# Patient Record
Sex: Male | Born: 1990 | Race: Black or African American | Hispanic: No | Marital: Single | Smoking: Former smoker
Health system: Southern US, Community
[De-identification: ages and names within clinical notes are randomized; demographics above are authoritative.]

## PROBLEM LIST (undated history)

## (undated) DIAGNOSIS — K649 Unspecified hemorrhoids: Secondary | ICD-10-CM

---

## 2013-02-22 ENCOUNTER — Encounter (HOSPITAL_COMMUNITY): Payer: Self-pay | Admitting: Emergency Medicine

## 2013-02-22 ENCOUNTER — Emergency Department (HOSPITAL_COMMUNITY)
Admission: EM | Admit: 2013-02-22 | Discharge: 2013-02-22 | Disposition: A | Discharge status: Home / Self Care | Payer: PRIVATE HEALTH INSURANCE | Attending: Emergency Medicine | Admitting: Emergency Medicine

## 2013-02-22 DIAGNOSIS — K029 Dental caries, unspecified: Secondary | ICD-10-CM | POA: Insufficient documentation

## 2013-02-22 DIAGNOSIS — K089 Disorder of teeth and supporting structures, unspecified: Secondary | ICD-10-CM | POA: Insufficient documentation

## 2013-02-22 DIAGNOSIS — K0889 Other specified disorders of teeth and supporting structures: Secondary | ICD-10-CM

## 2013-02-22 DIAGNOSIS — F17200 Nicotine dependence, unspecified, uncomplicated: Secondary | ICD-10-CM | POA: Insufficient documentation

## 2013-02-22 MED ORDER — HYDROCODONE-ACETAMINOPHEN 5-325 MG PO TABS: 1.0000 | ORAL_TABLET | ORAL | Status: DC | PRN

## 2013-02-22 MED ORDER — PENICILLIN V POTASSIUM 500 MG PO TABS: 500.0000 mg | ORAL_TABLET | Freq: Three times a day (TID) | ORAL | Status: DC

## 2013-02-22 MED ORDER — HYDROCODONE-ACETAMINOPHEN 5-325 MG PO TABS
2.0000 | ORAL_TABLET | Freq: Once | ORAL | Status: AC
  Administered 2013-02-22: 2 via ORAL
  Filled 2013-02-22: qty 2

## 2013-02-22 NOTE — ED Notes (Signed)
C/o L lower toothache x 2 days.

## 2013-02-22 NOTE — ED Provider Notes (Signed)
CSN: [id number]     Arrival date & time 02/22/13  0049 History   None    Chief Complaint  Patient presents with  . Dental Pain   (Consider location/radiation/quality/duration/timing/severity/associated sxs/prior Treatment) HPI History provided by pt.   Pt presents w/ left lower dental pain x 2 days.  No relief w/ OTC analgesics.  No associated fever.  Denies trauma.  Has a dentist.   History reviewed. No pertinent past medical history. History reviewed. No pertinent past surgical history. No family history on file. History  Substance Use Topics  . Smoking status: Current Every Day Smoker  . Smokeless tobacco: Not on file  . Alcohol Use: No    Review of Systems  All other systems reviewed and are negative.    Allergies  Review of patient's allergies indicates not on file.  Home Medications   Current Outpatient Rx  Name  Route  Sig  Dispense  Refill  . HYDROcodone-acetaminophen (NORCO/VICODIN) 5-325 MG per tablet   Oral   Take 1 tablet by mouth every 4 (four) hours as needed for moderate pain.   15 tablet   0   . penicillin v potassium (VEETID) 500 MG tablet   Oral   Take 1 tablet (500 mg total) by mouth 3 (three) times daily.   30 tablet   0    BP 146/100  Pulse 61  Temp(Src) 98.1 F (36.7 C) (Oral)  Resp 18  Ht 6' (1.829 m)  Wt 226 lb (102.513 kg)  BMI 30.64 kg/m2  SpO2 100% Physical Exam  Nursing note and vitals reviewed. Constitutional: He is oriented to person, place, and time. He appears well-developed and well-nourished.  HENT:  Head: Normocephalic and atraumatic.  Mouth/Throat: Uvula is midline and oropharynx is clear and moist. No trismus in the jaw.  Deep dental carie top surface of L lower 1st molar.  Ttp w/ guarding. No edema/erythema of gingiva/buccal mucosa.    Eyes:  Normal appearance  Neck: Normal range of motion. Neck supple.  No submandibular edema  Lymphadenopathy:    He has no cervical adenopathy.  Neurological: He is alert and  oriented to person, place, and time.  Psychiatric: He has a normal mood and affect. His behavior is normal.    ED Course  Procedures (including critical care time) Labs Review Labs Reviewed - No data to display Imaging Review No results found.  EKG Interpretation   None       MDM   1. Pain, dental    22yo healthy M presents w/ dental pain.  Will treat for possible periapical abscess.  Received first dose of vicodin and penicillin in ED.  He has a dentist to f/u with.  Return precautions discussed.     Otilio Miu, PA-C 02/22/13 1515  Otilio Miu, PA-C 02/22/13 [phone]

## 2013-02-22 NOTE — ED Provider Notes (Signed)
Medical screening examination/treatment/procedure(s) were performed by non-physician practitioner and as supervising physician I was immediately available for consultation/collaboration.  EKG Interpretation   None         Mark Boone Jonathan Corpus, MD 02/22/13 1850 

## 2013-02-22 NOTE — ED Notes (Signed)
Pt reports his friend is driving him home.  

## 2014-05-21 ENCOUNTER — Emergency Department (HOSPITAL_COMMUNITY)
Admission: EM | Admit: 2014-05-21 | Discharge: 2014-05-21 | Disposition: A | Discharge status: Home / Self Care | Payer: 59 | Attending: Emergency Medicine | Admitting: Emergency Medicine

## 2014-05-21 ENCOUNTER — Encounter (HOSPITAL_COMMUNITY): Payer: Self-pay | Admitting: Emergency Medicine

## 2014-05-21 DIAGNOSIS — J029 Acute pharyngitis, unspecified: Secondary | ICD-10-CM | POA: Diagnosis not present

## 2014-05-21 DIAGNOSIS — Z720 Tobacco use: Secondary | ICD-10-CM | POA: Diagnosis not present

## 2014-05-21 DIAGNOSIS — J039 Acute tonsillitis, unspecified: Secondary | ICD-10-CM

## 2014-05-21 LAB — RAPID STREP SCREEN (MED CTR MEBANE ONLY): STREPTOCOCCUS, GROUP A SCREEN (DIRECT): NEGATIVE

## 2014-05-21 MED ORDER — HYDROCODONE-ACETAMINOPHEN 7.5-325 MG/15ML PO SOLN: 10.0000 mL | Freq: Four times a day (QID) | ORAL | Status: DC | PRN

## 2014-05-21 MED ORDER — IBUPROFEN 600 MG PO TABS: 600.0000 mg | ORAL_TABLET | Freq: Four times a day (QID) | ORAL | Status: DC | PRN

## 2014-05-21 NOTE — ED Notes (Signed)
The patient said he has had throat pain for the last two day.  He said he noticed white patches on his tonsils today.  He rates his pain 7/10.  He did say he took tylenol extra strength for the pain.  He denies any cough.

## 2014-05-21 NOTE — ED Provider Notes (Signed)
CSN: [id number]     Arrival date & time 05/21/14  1957 History  This chart is scribed for non-physician practitioner, Junius FinnerErin O'Malley, PA-C, working with Gilda Creasehristopher J. Pollina, MD by Abel PrestoKara Demonbreun, ED Scribe.  This patient was seen in room TR05C/TR05C and the patient's care was started 8:49 PM.     Chief Complaint  Patient presents with  . Sore Throat    The patient said he has had throat pain for the last two day.  He said he noticed white patches on his tonsils today.  He rates his pain 7/10.     Patient is a 24 y.o. male presenting with pharyngitis. The history is provided by the patient. No language interpreter was used.  Sore Throat   HPI Comments: Mark Boone is a 24 y.o. male who presents to the Emergency Department complaining of 7/10 sore throat with onset yesterday. Pt notes subjective fever. Pt has taken Tylenol for relief.  He has been able to swallow liquids without difficulty. Pt has NKDA. Pt denies cough, SOB, nausea, vomiting, and abdominal pain. No sick contacts or recent travel.   History reviewed. No pertinent past medical history. History reviewed. No pertinent past surgical history. History reviewed. No pertinent family history. History  Substance Use Topics  . Smoking status: Current Every Day Smoker  . Smokeless tobacco: Not on file  . Alcohol Use: No    Review of Systems  Constitutional: Positive for fever.  HENT: Positive for sore throat.   Respiratory: Negative for cough.   Gastrointestinal: Negative for nausea, vomiting and abdominal distention.  All other systems reviewed and are negative.     Allergies  Review of patient's allergies indicates no known allergies.  Home Medications   Prior to Admission medications   Medication Sig Start Date End Date Taking? Authorizing Provider  acetaminophen (TYLENOL) 160 MG/5ML solution Take 240 mg by mouth every 6 (six) hours as needed for moderate pain.   Yes Historical Provider, MD   HYDROcodone-acetaminophen (HYCET) 7.5-325 mg/15 ml solution Take 10 mLs by mouth every 6 (six) hours as needed for moderate pain. 05/21/14 05/21/15  Junius FinnerErin O'Malley, PA-C  HYDROcodone-acetaminophen (NORCO/VICODIN) 5-325 MG per tablet Take 1 tablet by mouth every 4 (four) hours as needed for moderate pain. Patient not taking: Reported on 05/21/2014 02/22/13   Arie Sabinaatherine E Schinlever, PA-C  ibuprofen (ADVIL,MOTRIN) 600 MG tablet Take 1 tablet (600 mg total) by mouth every 6 (six) hours as needed. 05/21/14   Junius FinnerErin O'Malley, PA-C  penicillin v potassium (VEETID) 500 MG tablet Take 1 tablet (500 mg total) by mouth 3 (three) times daily. Patient not taking: Reported on 05/21/2014 02/22/13   Arie Sabinaatherine E Schinlever, PA-C   BP 117/71 mmHg  Pulse 75  Temp(Src) 99.3 F (37.4 C) (Oral)  Resp 18  SpO2 98% Physical Exam  Constitutional: He is oriented to person, place, and time. He appears well-developed and well-nourished.  HENT:  Head: Normocephalic and atraumatic.  Right Ear: Hearing, tympanic membrane, external ear and ear canal normal.  Left Ear: Hearing, tympanic membrane, external ear and ear canal normal.  Nose: Nose normal.  Mouth/Throat: Uvula is midline and mucous membranes are normal. No trismus in the jaw. Oropharyngeal exudate, posterior oropharyngeal edema and posterior oropharyngeal erythema present. No tonsillar abscesses.  Eyes: EOM are normal.  Neck: Normal range of motion. Neck supple.  No nuchal rigidity or meningeal signs.   Cardiovascular: Normal rate, regular rhythm and normal heart sounds.   Pulmonary/Chest: Effort normal. No stridor. No respiratory  distress. He has no wheezes. He has no rales. He exhibits no tenderness.  Musculoskeletal: Normal range of motion.  Neurological: He is alert and oriented to person, place, and time.  Skin: Skin is warm and dry.  Psychiatric: He has a normal mood and affect. His behavior is normal.  Nursing note and vitals reviewed.   ED Course  Procedures  (including critical care time) DIAGNOSTIC STUDIES: Oxygen Saturation is 97% on room air, normal by my interpretation.    COORDINATION OF CARE: 8:51 PM Discussed treatment plan with patient at beside, the patient agrees with the plan and has no further questions at this time.   Labs Review Labs Reviewed  RAPID STREP SCREEN  CULTURE, GROUP A STREP    Imaging Review No results found.   EKG Interpretation None      MDM   Final diagnoses:  Sore throat  Tonsillitis   Pt presenting to ED with sore throat, bilateral erythema, edema, and exudate present. No respiratory distress, No stridor.  Rapid strep: negative.  Strep Culture pending.  Will tx pt symptomatically for pain. Rx: hycet and ibuprofen. Home care instructions provided. Advised to f/u with PCP in 3-4 days for symptom recheck. Advised to return to ED for worsening symptoms including difficulty breathing, unable to keep down fluids, or other new concerning symptoms develop.  I personally performed the services described in this documentation, which was scribed in my presence. The recorded information has been reviewed and is accurate.    Junius Finner, PA-C 05/21/14 2223  Gilda Crease, MD 05/21/14 [phone]

## 2014-05-23 ENCOUNTER — Encounter (HOSPITAL_COMMUNITY): Payer: Self-pay | Admitting: Physical Medicine and Rehabilitation

## 2014-05-23 ENCOUNTER — Emergency Department (HOSPITAL_COMMUNITY)
Admission: EM | Admit: 2014-05-23 | Discharge: 2014-05-23 | Disposition: A | Discharge status: Home / Self Care | Payer: 59 | Attending: Emergency Medicine | Admitting: Emergency Medicine

## 2014-05-23 DIAGNOSIS — J029 Acute pharyngitis, unspecified: Secondary | ICD-10-CM | POA: Diagnosis not present

## 2014-05-23 DIAGNOSIS — Z79899 Other long term (current) drug therapy: Secondary | ICD-10-CM | POA: Diagnosis not present

## 2014-05-23 DIAGNOSIS — Z720 Tobacco use: Secondary | ICD-10-CM | POA: Insufficient documentation

## 2014-05-23 DIAGNOSIS — Z792 Long term (current) use of antibiotics: Secondary | ICD-10-CM | POA: Insufficient documentation

## 2014-05-23 LAB — RAPID HIV SCREEN (WH-MAU): Rapid HIV Screen: NONREACTIVE

## 2014-05-23 LAB — MONONUCLEOSIS SCREEN: MONO SCREEN: NEGATIVE

## 2014-05-23 MED ORDER — OXYCODONE-ACETAMINOPHEN 5-325 MG PO TABS: 1.0000 | ORAL_TABLET | Freq: Once | ORAL | Status: DC

## 2014-05-23 MED ORDER — AMOXICILLIN 500 MG PO CAPS: 500.0000 mg | ORAL_CAPSULE | Freq: Three times a day (TID) | ORAL | Status: DC

## 2014-05-23 MED ORDER — DEXAMETHASONE SODIUM PHOSPHATE 10 MG/ML IJ SOLN
10.0000 mg | Freq: Once | INTRAMUSCULAR | Status: AC
  Administered 2014-05-23: 10 mg via INTRAVENOUS
  Filled 2014-05-23: qty 1

## 2014-05-23 NOTE — Discharge Instructions (Signed)
Amoxicillin as prescribed until all gone. Continue motrin and tylenol for fever and pain. Follow up with your doctor or an urgent care if not improving. Mono screen and HIV screen negative in ED.   Pharyngitis Pharyngitis is redness, pain, and swelling (inflammation) of your pharynx.  CAUSES  Pharyngitis is usually caused by infection. Most of the time, these infections are from viruses (viral) and are part of a cold. However, sometimes pharyngitis is caused by bacteria (bacterial). Pharyngitis can also be caused by allergies. Viral pharyngitis may be spread from person to person by coughing, sneezing, and personal items or utensils (cups, forks, spoons, toothbrushes). Bacterial pharyngitis may be spread from person to person by more intimate contact, such as kissing.  SIGNS AND SYMPTOMS  Symptoms of pharyngitis include:   Sore throat.   Tiredness (fatigue).   Low-grade fever.   Headache.  Joint pain and muscle aches.  Skin rashes.  Swollen lymph nodes.  Plaque-like film on throat or tonsils (often seen with bacterial pharyngitis). DIAGNOSIS  Your health care provider will ask you questions about your illness and your symptoms. Your medical history, along with a physical exam, is often all that is needed to diagnose pharyngitis. Sometimes, a rapid strep test is done. Other lab tests may also be done, depending on the suspected cause.  TREATMENT  Viral pharyngitis will usually get better in 3-4 days without the use of medicine. Bacterial pharyngitis is treated with medicines that kill germs (antibiotics).  HOME CARE INSTRUCTIONS   Drink enough water and fluids to keep your urine clear or pale yellow.   Only take over-the-counter or prescription medicines as directed by your health care provider:   If you are prescribed antibiotics, make sure you finish them even if you start to feel better.   Do not take aspirin.   Get lots of rest.   Gargle with 8 oz of salt water (  tsp of salt per 1 qt of water) as often as every 1-2 hours to soothe your throat.   Throat lozenges (if you are not at risk for choking) or sprays may be used to soothe your throat. SEEK MEDICAL CARE IF:   You have large, tender lumps in your neck.  You have a rash.  You cough up green, yellow-brown, or bloody spit. SEEK IMMEDIATE MEDICAL CARE IF:   Your neck becomes stiff.  You drool or are unable to swallow liquids.  You vomit or are unable to keep medicines or liquids down.  You have severe pain that does not go away with the use of recommended medicines.  You have trouble breathing (not caused by a stuffy nose). MAKE SURE YOU:   Understand these instructions.  Will watch your condition.  Will get help right away if you are not doing well or get worse. Document Released: 03/04/2005 Document Revised: 12/23/2012 Document Reviewed: 11/09/2012 [facility] [employer] Patient Information 2015 [place] [place]. This information is not intended to replace advice given to you by your health care provider. Make sure you discuss any questions you have with your health care provider.

## 2014-05-23 NOTE — ED Provider Notes (Signed)
CSN: [id number]     Arrival date & time 05/23/14  0945 History   First MD Initiated Contact with Patient 05/23/14 1015     Chief Complaint  Patient presents with  . Sore Throat     (Consider location/radiation/quality/duration/timing/severity/associated sxs/prior Treatment) HPI Mark Boone is a 24 y.o. male with no medical problems, presents to ED with complaint of a sore throat. Pt states symptoms started 4 days ago. Patient states his throat feels swollen, states having difficulty with swallowing. With seen 2 days ago, had negative rapid strep, states was discharged with ibuprofen and hydrocodone which he has been taking. Last dose at 4 AM this morning. Today he feels like his swelling is even worse so he came back to the emergency department. He admits to fever and chills at home. He denies any nasal congestion, cough, ear pain. No headache, neck pain or stiffness. No other complaints.   History reviewed. No pertinent past medical history. History reviewed. No pertinent past surgical history. History reviewed. No pertinent family history. History  Substance Use Topics  . Smoking status: Current Every Day Smoker    Types: Cigarettes  . Smokeless tobacco: Not on file  . Alcohol Use: No    Review of Systems  Constitutional: Positive for fever and chills.  HENT: Positive for sore throat, trouble swallowing and voice change. Negative for congestion, ear pain, facial swelling, mouth sores and sinus pressure.   Respiratory: Negative for cough and shortness of breath.   Gastrointestinal: Negative for nausea, vomiting and abdominal pain.  Musculoskeletal: Negative for neck pain and neck stiffness.  Skin: Negative for rash.  Neurological: Negative for headaches.      Allergies  Review of patient's allergies indicates no known allergies.  Home Medications   Prior to Admission medications   Medication Sig Start Date End Date Taking? Authorizing Provider  acetaminophen  (TYLENOL) 160 MG/5ML solution Take 240 mg by mouth every 6 (six) hours as needed for moderate pain.    Historical Provider, MD  HYDROcodone-acetaminophen (HYCET) 7.5-325 mg/15 ml solution Take 10 mLs by mouth every 6 (six) hours as needed for moderate pain. 05/21/14 05/21/15  Junius FinnerErin O'Malley, PA-C  HYDROcodone-acetaminophen (NORCO/VICODIN) 5-325 MG per tablet Take 1 tablet by mouth every 4 (four) hours as needed for moderate pain. Patient not taking: Reported on 05/21/2014 02/22/13   Ruby Colaatherine Schinlever, PA-C  ibuprofen (ADVIL,MOTRIN) 600 MG tablet Take 1 tablet (600 mg total) by mouth every 6 (six) hours as needed. 05/21/14   Junius FinnerErin O'Malley, PA-C  penicillin v potassium (VEETID) 500 MG tablet Take 1 tablet (500 mg total) by mouth 3 (three) times daily. Patient not taking: Reported on 05/21/2014 02/22/13   Ruby Colaatherine Schinlever, PA-C   BP 132/74 mmHg  Pulse 80  Temp(Src) 98.3 F (36.8 C) (Oral)  Resp 18  Ht 6' (1.829 m)  Wt 210 lb (95.255 kg)  BMI 28.47 kg/m2  SpO2 99% Physical Exam  Constitutional: He appears well-developed and well-nourished. No distress.  HENT:  Head: Normocephalic and atraumatic.  Right Ear: Tympanic membrane, external ear and ear canal normal.  Left Ear: Tympanic membrane, external ear and ear canal normal.  Nose: Nose normal.  Mouth/Throat: Uvula is midline. Oropharyngeal exudate, posterior oropharyngeal edema and posterior oropharyngeal erythema present. No tonsillar abscesses.  Eyes: Conjunctivae are normal.  Neck: Neck supple.  Cardiovascular: Normal rate, regular rhythm and normal heart sounds.   Pulmonary/Chest: Effort normal. No respiratory distress. He has no wheezes. He has no rales.  Abdominal: Soft. Bowel sounds  are normal. He exhibits no distension. There is no tenderness. There is no rebound.  Musculoskeletal: He exhibits no edema.  Neurological: He is alert.  Skin: Skin is warm and dry.  Nursing note and vitals reviewed.   ED Course  Procedures (including  critical care time) Labs Review Labs Reviewed  MONONUCLEOSIS SCREEN    Imaging Review No results found.   EKG Interpretation None      MDM   Final diagnoses:  Pharyngitis    Patient are bilaterally enlarged tonsils with exudate. Uvula is midline. Tonsils are equal in size. Patient has no other complaints. Afebrile here, however 2 days ago he did have a temperature of 100.3 during his visit. His rapid strep was negative, once the culture still pending. I spoke with his mother on the phone who is very concerned about throat swelling and that he will have trouble breathing.  will give him decadron for swelling. Will check mono.   Mono spot negative. Home with amoxil, follow up with pcp.   Filed Vitals:   05/23/14 0949 05/23/14 1239  BP: 132/74 123/62  Pulse: 80 63  Temp: 98.3 F (36.8 C)   TempSrc: Oral   Resp: 18 16  Height: 6' (1.829 m)   Weight: 210 lb (95.255 kg)   SpO2: 99% 100%     Jaynie Crumble, PA-C 05/23/14 1635  Eber Hong, MD 05/23/14 1610  Eber Hong, MD 05/23/14 2043

## 2014-05-23 NOTE — ED Notes (Signed)
Pt presents to department for evaluation of sore throat. Was seen on 05/21/14 and diagnosed with tonsillitis. Pt reports increased pain this morning. Respirations unlabored. Speaking complete sentences. NAD.

## 2014-05-23 NOTE — ED Notes (Signed)
Pt here 2/5 for sore throat. Returns today for worsening throat swelling and pain. Tonsils very enlarged and with copious exudate. Pt reports fever and chills.

## 2014-05-24 LAB — CULTURE, GROUP A STREP: STREP A CULTURE: NEGATIVE

## 2015-03-22 ENCOUNTER — Encounter (HOSPITAL_COMMUNITY): Payer: Self-pay | Admitting: *Deleted

## 2015-03-22 ENCOUNTER — Emergency Department (HOSPITAL_COMMUNITY)
Admission: EM | Admit: 2015-03-22 | Discharge: 2015-03-22 | Disposition: A | Discharge status: Home / Self Care | Payer: 59 | Attending: Emergency Medicine | Admitting: Emergency Medicine

## 2015-03-22 DIAGNOSIS — F17210 Nicotine dependence, cigarettes, uncomplicated: Secondary | ICD-10-CM | POA: Diagnosis not present

## 2015-03-22 DIAGNOSIS — N509 Disorder of male genital organs, unspecified: Secondary | ICD-10-CM | POA: Diagnosis present

## 2015-03-22 DIAGNOSIS — K6289 Other specified diseases of anus and rectum: Secondary | ICD-10-CM | POA: Diagnosis not present

## 2015-03-22 DIAGNOSIS — K648 Other hemorrhoids: Secondary | ICD-10-CM | POA: Diagnosis not present

## 2015-03-22 DIAGNOSIS — B356 Tinea cruris: Secondary | ICD-10-CM | POA: Diagnosis not present

## 2015-03-22 DIAGNOSIS — K649 Unspecified hemorrhoids: Secondary | ICD-10-CM

## 2015-03-22 LAB — CBC WITH DIFFERENTIAL/PLATELET
Basophils Absolute: 0 10*3/uL (ref 0.0–0.1)
Basophils Relative: 1 %
EOS ABS: 0.1 10*3/uL (ref 0.0–0.7)
Eosinophils Relative: 2 %
HCT: 44.7 % (ref 39.0–52.0)
Hemoglobin: 14.9 g/dL (ref 13.0–17.0)
LYMPHS ABS: 3.1 10*3/uL (ref 0.7–4.0)
Lymphocytes Relative: 53 %
MCH: 31.2 pg (ref 26.0–34.0)
MCHC: 33.3 g/dL (ref 30.0–36.0)
MCV: 93.7 fL (ref 78.0–100.0)
MONO ABS: 0.4 10*3/uL (ref 0.1–1.0)
Monocytes Relative: 6 %
Neutro Abs: 2.2 10*3/uL (ref 1.7–7.7)
Neutrophils Relative %: 38 %
PLATELETS: 155 10*3/uL (ref 150–400)
RBC: 4.77 MIL/uL (ref 4.22–5.81)
RDW: 12.2 % (ref 11.5–15.5)
WBC: 5.8 10*3/uL (ref 4.0–10.5)

## 2015-03-22 LAB — URINALYSIS, ROUTINE W REFLEX MICROSCOPIC
Bilirubin Urine: NEGATIVE
GLUCOSE, UA: NEGATIVE mg/dL
Hgb urine dipstick: NEGATIVE
Ketones, ur: NEGATIVE mg/dL
LEUKOCYTES UA: NEGATIVE
NITRITE: NEGATIVE
PH: 6 (ref 5.0–8.0)
Protein, ur: NEGATIVE mg/dL
Specific Gravity, Urine: 1.026 (ref 1.005–1.030)

## 2015-03-22 LAB — POC OCCULT BLOOD, ED: Fecal Occult Bld: NEGATIVE

## 2015-03-22 LAB — RAPID HIV SCREEN (HIV 1/2 AB+AG)
HIV 1/2 Antibodies: NONREACTIVE
HIV-1 P24 ANTIGEN - HIV24: NONREACTIVE

## 2015-03-22 LAB — GC/CHLAMYDIA PROBE AMP (~~LOC~~) NOT AT ARMC
CHLAMYDIA, DNA PROBE: NEGATIVE
Neisseria Gonorrhea: NEGATIVE

## 2015-03-22 MED ORDER — IBUPROFEN 800 MG PO TABS: 800.0000 mg | ORAL_TABLET | Freq: Three times a day (TID) | ORAL | Status: DC

## 2015-03-22 MED ORDER — HYDROCORTISONE ACETATE 25 MG RE SUPP: 25.0000 mg | Freq: Two times a day (BID) | RECTAL | Status: DC

## 2015-03-22 MED ORDER — ECONAZOLE NITRATE 1 % EX CREA: TOPICAL_CREAM | Freq: Two times a day (BID) | CUTANEOUS | Status: DC

## 2015-03-22 NOTE — ED Notes (Signed)
Provider at the bedside.  

## 2015-03-22 NOTE — ED Provider Notes (Signed)
CSN: [id number]     Arrival date & time 03/22/15  0404 History   First MD Initiated Contact with Patient 03/22/15 0615     Chief Complaint  Patient presents with  . Genital Irritation      (Consider location/radiation/quality/duration/timing/severity/associated sxs/prior Treatment) HPI   TRUE Mark Boone is a 25 y.o. male, patient with no pertinent past medical history, presenting to the ED with pain with BMs for the last two weeks. Pt also complains of some blood on the paper. Pt rates the pain at 6/10 when it comes on, throbbing in natural, non-radiating, and slowly subsides after BM. Pt denies trauma to the area, including sexual contact. Patient additionally complains of some itching on the inside of his thighs that is been going on for about a week. Patient denies pain in this area. Pt denies fever/chills, N/V/C/D, abdominal pain, dysuria, penile pain or discharge, or any other complaints.     History reviewed. No pertinent past medical history. History reviewed. No pertinent past surgical history. No family history on file. Social History  Substance Use Topics  . Smoking status: Current Every Day Smoker    Types: Cigarettes  . Smokeless tobacco: Never Used  . Alcohol Use: Yes    Review of Systems  Constitutional: Negative for fever and chills.  Gastrointestinal: Negative for vomiting, abdominal pain, diarrhea, constipation and blood in stool.       Pain with BMs  Genitourinary: Negative for dysuria, hematuria, discharge, penile pain and testicular pain.  All other systems reviewed and are negative.     Allergies  Review of patient's allergies indicates no known allergies.  Home Medications   Prior to Admission medications   Medication Sig Start Date End Date Taking? Authorizing Provider  econazole nitrate 1 % cream Apply topically 2 (two) times daily. Until irritation subsides. Discontinue use and return for reevaluation if irritation increases. 03/22/15   Shawn C  Joy, PA-C  hydrocortisone (ANUCORT-HC) 25 MG suppository Place 1 suppository (25 mg total) rectally 2 (two) times daily. 03/22/15   Shawn C Joy, PA-C  ibuprofen (ADVIL,MOTRIN) 800 MG tablet Take 1 tablet (800 mg total) by mouth 3 (three) times daily. 03/22/15   Shawn C Joy, PA-C   BP 139/97 mmHg  Pulse 77  Temp(Src) 99.2 F (37.3 C) (Oral)  Resp 18  Ht 6' (1.829 m)  Wt 92.262 kg  BMI 27.58 kg/m2  SpO2 100% Physical Exam  Constitutional: He appears well-developed and well-nourished. No distress.  HENT:  Head: Normocephalic and atraumatic.  Eyes: Conjunctivae are normal. Pupils are equal, round, and reactive to light.  Cardiovascular: Normal rate and regular rhythm.   Pulmonary/Chest: Effort normal and breath sounds normal. No respiratory distress.  Abdominal: Soft. Bowel sounds are normal. There is no tenderness.  Genitourinary: Prostate normal and penis normal. Rectal exam shows internal hemorrhoid and tenderness. Guaiac negative stool.    Circumcised.  Penis and genitals without lesions. No testicular swelling or tenderness. No penile discharge. Rectal exam revealed soft, tender mass on the posterior wall of the rectum. No fluctuance to suggest abscess. No firmness to suggest a thrombosed hemorrhoid. No tenderness to the prostate. Med Tech, Malachi Bonds, served as Biomedical engineer during GU and rectal exam.  Musculoskeletal: He exhibits no edema or tenderness.  Neurological: He is alert.  Skin: Skin is warm and dry. He is not diaphoretic.  Nursing note and vitals reviewed.   ED Course  Procedures (including critical care time) Labs Review Labs Reviewed  URINALYSIS, ROUTINE W REFLEX MICROSCOPIC (  [facility]) - Abnormal; Notable for the following:    Color, Urine AMBER (*)    APPearance CLOUDY (*)    All other components within normal limits  CBC WITH DIFFERENTIAL/PLATELET  RAPID HIV SCREEN (HIV 1/2 AB+AG)  POC OCCULT BLOOD, ED  GC/CHLAMYDIA PROBE AMP ([place]) NOT AT [facility]    Imaging  Review No results found. I have personally reviewed and evaluated these lab results as part of my medical decision-making.   EKG Interpretation None      MDM   Final diagnoses:  Rectal pain  Hemorrhoids, unspecified hemorrhoid type  Tinea cruris    Agustin CreeZachary Muzquiz presents with pain with bowel movements and genital irritation.  This patient's presentation is most consistent with internal hemorrhoids versus anal fissure. Doubt prostatitis or epididymitis. Patient's irritation on the inside of his legs is most likely tinea cruris. Patient was told that he would need to follow up with a PCP as soon as possible on the hemorrhoids. The patient was given instructions for home care as well as return precautions. Patient voices understanding of these instructions, accepts the plan, and is comfortable with discharge.  Anselm PancoastShawn C Joy, PA-C 03/22/15 [id number]  Lyndal Pulleyaniel Knott, MD 03/23/15 [phone]

## 2015-03-22 NOTE — Discharge Instructions (Signed)
You have been seen today for rectal pain and genital irritation. Follow up with PCP as soon as possible for follow-up and chronic management. Return to ED should symptoms worsen. Sitz baths may help soothe the area.   Emergency Department Resource Guide 1) Find a Doctor and Pay Out of Pocket Although you won't have to find out who is covered by your insurance plan, it is a good idea to ask around and get recommendations. You will then need to call the office and see if the doctor you have chosen will accept you as a new patient and what types of options they offer for patients who are self-pay. Some doctors offer discounts or will set up payment plans for their patients who do not have insurance, but you will need to ask so you aren't surprised when you get to your appointment.  2) Contact Your Local Health Department Not all health departments have doctors that can see patients for sick visits, but many do, so it is worth a call to see if yours does. If you don't know where your local health department is, you can check in your phone book. The CDC also has a tool to help you locate your state's health department, and many state websites also have listings of all of their local health departments.  3) Find a Walk-in Clinic If your illness is not likely to be very severe or complicated, you may want to try a walk in clinic. These are popping up all over the country in pharmacies, drugstores, and shopping centers. They're usually staffed by nurse practitioners or physician assistants that have been trained to treat common illnesses and complaints. They're usually fairly quick and inexpensive. However, if you have serious medical issues or chronic medical problems, these are probably not your best option.  No Primary Care Doctor: - Call Health Connect at  [phone] - they can help you locate a primary care doctor that  accepts your insurance, provides certain services, etc. - Physician Referral Service-  [phone]  Chronic Pain Problems: Organization         Address  Phone   Notes  [facility]  [phone] Patients need to be referred by their primary care doctor.   Medication Assistance: Organization         Address  Phone   Notes  [facility] [address] [place], [place] [address] [phone] --Must be a resident of [facility] [place] -- Must have NO insurance coverage whatsoever (no Medicaid/ Medicare, etc.) -- The pt. MUST have a primary care doctor that directs their care regularly and follows them in the community   Med[facility]ist  [phone]   [employer]  [phone]    Agencies that provide inexpensive medical care: Organization         Address  Phone   Notes  [facility]  [phone]   [facility]    [phone]   [facility] [address] [place], [place] [address] [phone]   [facility] [address], [place] [phone]   [facility]anned Parenthood    [phone]   [facility]    [phone]   Community Health and [facility]  [address], [place] Phone:  [phone], Fax:  [phone] Hours of Operation:  9 am - 6 pm, M-F.  Also accepts Medicaid/Medicare and self-pay.  [facility]  [place] for [place], [address], [place] Phone: [phone], Fax: [phone]. Hours of Operation:  8:30 am - 5:30 pm, M-F.  Also accepts Medicaid and self-pay.  [facility] [address] [place] Phone: [phone]   [place], [place], [place] [phone], Ext. [phone] Mondays & Thursdays: 7-9 AM.  First 15 patients are seen on a first come, first serve basis.    [place] Providers:  Organization         Address  Phone   Notes  [facility] [address],  [place] [phone] Also accepts self-pay patients.  [facility] [id number] [place], [place]  [phone]   [place], [address], [place] [phone]   [facility] [address] [place] [phone]   Lucianne Lei [address], [place]   [phone] Only accepts [place] Access [place] patients after they have their name applied to their card.   Self-Pay (no insurance) in [facility]:  Organization         Address  Phone   Notes  Sickle Cell Patients, [facility] [place] [phone]   [facility] [place] [phone]   [facility] [place]  [place], [place], [place] [phone]   [facility]/Dr. Osei-Bonsu  [address], [place] or [address], [place] [phone] Phone number for both [place] and [place] locations is the same.  [facility] [address] [place] [phone]   [facility]  [address], [place] or [address] [place] [address] [phone]   [facility] [address], [place] [phone], phone; [phone], fax Sees patients 1st and 3rd Saturday of every month.  Must not qualify for public or private insurance (i.e. Medicaid, Medicare, [place] Health Choice, Veterans' Benefits)  Household income should be no more than 200% of the poverty level The clinic cannot treat you if you are pregnant or think you are pregnant  Sexually transmitted diseases are not treated at the clinic.    Dental Care: Organization         Address  Phone  Notes  [facility] Department of [facility] [place] [phone] Accepts children up to age 40 who are enrolled in  [place] or [place]; pregnant women with a Medicaid card; and children who have applied for Medicaid or [place] Health Choice, but were declined, whose parents can pay a reduced fee at time of service.  [facility] of [facility]  [address], [place] [phone] Accepts children up to age 85 who are enrolled in [place] or [place]; pregnant women with a Medicaid card; and children who have applied for Medicaid or [place] Health Choice, but were declined, whose parents can pay a reduced fee at time of service.  [place] [facility] PROGRAM  [place] [phone] Patients are seen by appointment only. Walk-ins are not accepted. [place] will see patients 62 years of age and older. Monday - Tuesday (8am-5pm) Most Wednesdays (8:30-5pm) $30 per visit,  cash only  [employer] Adult [employer] PROGRAM  [address], [place] [phone] Patients are seen by appointment only. Walk-ins are not accepted. [place] will see patients 68 years of age and older. One Wednesday Evening (Monthly: Volunteer Based).  $30 per visit, cash only  [place]  [phone] for adults; Children under age 32, call Graduate Pediatric Dentistry at [phone]. Children aged 36-14, please call [phone] to request a pediatric application.  Dental services are provided in all areas of dental care including fillings, crowns and bridges, complete and partial dentures, implants, gum treatment, root canals, and extractions. Preventive care is also provided. Treatment is provided to both adults and children. Patients are selected via a lottery and there is often a waiting list.   [facility] [address] [place]  [phone] [email or web address]   [facility] [address] [place], [place] [phone], Ext. [phone] Second and Fourth Thursday of each month, opens at 6:30  AM; Clinic ends at 9 AM.  Patients are seen on a first-come first-served basis, and a limited number are seen during each clinic.   [facility]  [address] Hillard Danker [place], [place] [phone]   Eligibility Requirements You must have lived in [place], [place], or [place] counties for at least the last three months.   You cannot be eligible for state or federal sponsored [employer], including [employer], [place], or [employer].   You generally cannot be eligible for healthcare insurance through your employer.    How to apply: Eligibility screenings are held every Tuesday and Wednesday afternoon from 1:00 pm until 4:00 pm. You do not need an appointment for the interview!  [facility] [address] [place], [place]   [place]  [place] Department  [place]  [phone]    Behavioral Health Resources in the Community: Intensive Outpatient Programs Organization         Address  Phone  Notes  Bejou Flanagan. [address], [place], [place] [phone]   [facility] Outpatient [address] [place], [place]   ADS: Alcohol & Drug Svcs [address] [place], [place]   [place] [address],  [place], [place] or [phone]   Substance Abuse Resources Organization         Address  Phone  Notes  Alcohol and Drug Services  [phone]   [place]  [phone]   [facility] [place]   Chinita Pester  [phone]   Residential & Outpatient Substance Abuse Program  [phone]   Psychological Services Organization         Address  Phone  Notes  [facility] [place]  [place]  [phone]   [place] [address] [place] or  [phone]    Mobile Crisis Teams Organization         Address  Phone  Notes  [facility], Mobile Crisis Care Unit  [phone]   [facility]  [address] [place], [place]   [place] Levels [address], [place] [phone]    Self-Help/Support Groups Organization         Address  Phone  Notes  [facility] [place] - variety of support groups  [place] Call for more information  Narcotics Anonymous (NA), [facility] [address], [employer] [place]  2 meetings at this location   Special educational needs teacher         Address  Phone  Notes  [facility] Residential Treatment [place],    [place]  [phone]   [facility]  [address] [place] [phone], [place], [place]   [place], [place] [phone] Admissions: 8am-3pm M-F  Incentives Substance [place] [address]    [place], [place] [phone]   [facility] [address] [place], [place], [place]   [facility] [address],  [place], [place]   [facility] - Intensive Outpatient [address]een [address], [place], [place]   [facility] ([place] [address].,  [place], [place] [phone] or [phone]   [facility] (RTS) [address] [place], [place] Accepts Medicaid  [facility] [place] [address],  [place] [phone] Substance Abuse/Addiction Treatment   [facility] Organization         Address  Phone  Notes  [facility]  [phone]   Domenic Schwab, PhD [address] Arlis Porta [place], [place]   [phone] or [phone]   [place] and [place], [place] [phone]   [facility] [address],  [place], [place] [phone] Insurance/Medicaid/sponsorship through [facility] and Families [address], [address] [place]                                    [place], [place] [phone] [place] [address] [place], [place] [phone]    Dr. Adele Schilder  [phone]   [facility] Dept. 1) [address], [place] 2) [place] 3)  [place] [address], [place] [phone]  [phone]   [place] [phone] or [phone] (After Hours)

## 2015-03-22 NOTE — ED Notes (Signed)
Patient states when he urinates it throbs and then it goes away.  Denies penile discharge but has noticed blood at times.  Denies nunprotected sex

## 2015-08-19 ENCOUNTER — Emergency Department (HOSPITAL_COMMUNITY): Payer: PRIVATE HEALTH INSURANCE

## 2015-08-19 ENCOUNTER — Emergency Department (HOSPITAL_COMMUNITY)
Admission: EM | Admit: 2015-08-19 | Discharge: 2015-08-19 | Disposition: A | Discharge status: Home / Self Care | Payer: PRIVATE HEALTH INSURANCE | Attending: Emergency Medicine | Admitting: Emergency Medicine

## 2015-08-19 ENCOUNTER — Encounter (HOSPITAL_COMMUNITY): Payer: Self-pay | Admitting: Emergency Medicine

## 2015-08-19 DIAGNOSIS — F17210 Nicotine dependence, cigarettes, uncomplicated: Secondary | ICD-10-CM | POA: Insufficient documentation

## 2015-08-19 DIAGNOSIS — Y999 Unspecified external cause status: Secondary | ICD-10-CM | POA: Insufficient documentation

## 2015-08-19 DIAGNOSIS — S60211A Contusion of right wrist, initial encounter: Secondary | ICD-10-CM | POA: Insufficient documentation

## 2015-08-19 DIAGNOSIS — Y9339 Activity, other involving climbing, rappelling and jumping off: Secondary | ICD-10-CM | POA: Insufficient documentation

## 2015-08-19 DIAGNOSIS — S0990XA Unspecified injury of head, initial encounter: Secondary | ICD-10-CM

## 2015-08-19 DIAGNOSIS — Y929 Unspecified place or not applicable: Secondary | ICD-10-CM | POA: Insufficient documentation

## 2015-08-19 DIAGNOSIS — S0083XA Contusion of other part of head, initial encounter: Secondary | ICD-10-CM | POA: Insufficient documentation

## 2015-08-19 LAB — I-STAT CHEM 8, ED
BUN: 13 mg/dL (ref 6–20)
Calcium, Ion: 1.16 mmol/L (ref 1.12–1.23)
Chloride: 102 mmol/L (ref 101–111)
Creatinine, Ser: 0.9 mg/dL (ref 0.61–1.24)
Glucose, Bld: 93 mg/dL (ref 65–99)
HCT: 47 % (ref 39.0–52.0)
Hemoglobin: 16 g/dL (ref 13.0–17.0)
Potassium: 3.8 mmol/L (ref 3.5–5.1)
Sodium: 142 mmol/L (ref 135–145)
TCO2: 25 mmol/L (ref 0–100)

## 2015-08-19 MED ORDER — IBUPROFEN 200 MG PO TABS
600.0000 mg | ORAL_TABLET | Freq: Once | ORAL | Status: AC
Start: 2015-08-19 — End: 2015-08-19
  Administered 2015-08-19: 600 mg via ORAL
  Filled 2015-08-19: qty 3

## 2015-08-19 MED ORDER — ACETAMINOPHEN 500 MG PO TABS
1000.0000 mg | ORAL_TABLET | Freq: Once | ORAL | Status: AC
  Administered 2015-08-19: 1000 mg via ORAL
  Filled 2015-08-19: qty 2

## 2015-08-19 MED ORDER — IBUPROFEN 800 MG PO TABS: 800.0000 mg | ORAL_TABLET | Freq: Three times a day (TID) | ORAL | Status: DC | PRN

## 2015-08-19 NOTE — Discharge Instructions (Signed)
Return here as needed.  Follow-up with a primary care doctor.  Use ice on the areas that are sore
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
## 2015-08-19 NOTE — ED Notes (Signed)
Patient transported to X-ray and CT 
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
## 2017-05-30 ENCOUNTER — Other Ambulatory Visit: Payer: Self-pay

## 2017-05-30 ENCOUNTER — Encounter (HOSPITAL_COMMUNITY): Payer: Self-pay | Admitting: Emergency Medicine

## 2017-05-30 ENCOUNTER — Emergency Department (HOSPITAL_COMMUNITY)
Admission: EM | Admit: 2017-05-30 | Discharge: 2017-05-30 | Disposition: A | Discharge status: Home / Self Care | Payer: PRIVATE HEALTH INSURANCE | Attending: Emergency Medicine | Admitting: Emergency Medicine

## 2017-05-30 DIAGNOSIS — Z206 Contact with and (suspected) exposure to human immunodeficiency virus [HIV]: Secondary | ICD-10-CM

## 2017-05-30 DIAGNOSIS — F17210 Nicotine dependence, cigarettes, uncomplicated: Secondary | ICD-10-CM | POA: Insufficient documentation

## 2017-05-30 DIAGNOSIS — Z202 Contact with and (suspected) exposure to infections with a predominantly sexual mode of transmission: Secondary | ICD-10-CM | POA: Insufficient documentation

## 2017-05-30 DIAGNOSIS — Z7252 High risk homosexual behavior: Secondary | ICD-10-CM | POA: Insufficient documentation

## 2017-05-30 LAB — URINALYSIS, ROUTINE W REFLEX MICROSCOPIC
Bilirubin Urine: NEGATIVE
Glucose, UA: NEGATIVE mg/dL
Hgb urine dipstick: NEGATIVE
Ketones, ur: 5 mg/dL — AB
Leukocytes, UA: NEGATIVE
NITRITE: NEGATIVE
Protein, ur: NEGATIVE mg/dL
Specific Gravity, Urine: 1.032 — ABNORMAL HIGH (ref 1.005–1.030)
pH: 6 (ref 5.0–8.0)

## 2017-05-30 LAB — COMPREHENSIVE METABOLIC PANEL
ALK PHOS: 55 U/L (ref 38–126)
ALT: 20 U/L (ref 17–63)
AST: 31 U/L (ref 15–41)
Albumin: 4.6 g/dL (ref 3.5–5.0)
Anion gap: 10 (ref 5–15)
BILIRUBIN TOTAL: 0.7 mg/dL (ref 0.3–1.2)
BUN: 12 mg/dL (ref 6–20)
CALCIUM: 9.6 mg/dL (ref 8.9–10.3)
CO2: 25 mmol/L (ref 22–32)
CREATININE: 0.87 mg/dL (ref 0.61–1.24)
Chloride: 105 mmol/L (ref 101–111)
Glucose, Bld: 92 mg/dL (ref 65–99)
Potassium: 4.5 mmol/L (ref 3.5–5.1)
Sodium: 140 mmol/L (ref 135–145)
Total Protein: 7.6 g/dL (ref 6.5–8.1)

## 2017-05-30 LAB — CBC
HCT: 44.8 % (ref 39.0–52.0)
Hemoglobin: 14.9 g/dL (ref 13.0–17.0)
MCH: 32 pg (ref 26.0–34.0)
MCHC: 33.3 g/dL (ref 30.0–36.0)
MCV: 96.3 fL (ref 78.0–100.0)
PLATELETS: 162 10*3/uL (ref 150–400)
RBC: 4.65 MIL/uL (ref 4.22–5.81)
RDW: 12.2 % (ref 11.5–15.5)
WBC: 5.2 10*3/uL (ref 4.0–10.5)

## 2017-05-30 LAB — RAPID HIV SCREEN (HIV 1/2 AB+AG)
HIV 1/2 ANTIBODIES: NONREACTIVE
HIV-1 P24 ANTIGEN - HIV24: NONREACTIVE

## 2017-05-30 MED ORDER — EMTRICITABINE-TENOFOVIR DF 200-300 MG PO TABS: 1.0000 | ORAL_TABLET | Freq: Every day | ORAL | 0 refills | Status: DC

## 2017-05-30 MED ORDER — ONDANSETRON 4 MG PO TBDP: 4.0000 mg | ORAL_TABLET | Freq: Three times a day (TID) | ORAL | 0 refills | Status: DC | PRN

## 2017-05-30 MED ORDER — DOLUTEGRAVIR SODIUM 50 MG PO TABS: 50.0000 mg | ORAL_TABLET | Freq: Every day | ORAL | 0 refills | Status: DC
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
## 2017-05-30 NOTE — Discharge Instructions (Signed)
Please start taking Truvada and Tivicay daily for the next 28days  This medicine can make you nauseated, I have written her prescription for Zofran which you can use as needed.  Follow-up with infectious disease doctor (Dr. Luciana Axeomer) for continued monitoring.  I have listed the information below.  You have chlamydia cultures, gonorrhea cultures, syphilis and HIV testing which is pending.  You will receive a phone call if these results are positive.   Please return to the emergency department if you have a fever greater than 100.4 F, vomiting that does not stop or have any new or concerning symptoms.
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
## 2017-05-30 NOTE — ED Notes (Signed)
Patient given discharge instructions and verbalized understanding.  Patient stable to discharge at this time.  Patient is alert and oriented to baseline.  No distressed noted at this time.  All belongings taken with the patient at discharge.   

## 2017-05-31 LAB — HEPATITIS PANEL, ACUTE
HCV Ab: 0.7 s/co ratio (ref 0.0–0.9)
HEP A IGM: NEGATIVE
HEP B C IGM: NEGATIVE
HEP B S AG: NEGATIVE

## 2017-05-31 LAB — RPR: RPR Ser Ql: NONREACTIVE

## 2017-05-31 LAB — HEPATITIS B SURFACE ANTIBODY,QUALITATIVE: HEP B S AB: REACTIVE

## 2017-06-02 LAB — GC/CHLAMYDIA PROBE AMP (~~LOC~~) NOT AT ARMC
CHLAMYDIA, DNA PROBE: NEGATIVE
Neisseria Gonorrhea: NEGATIVE

## 2017-08-09 ENCOUNTER — Other Ambulatory Visit: Payer: Self-pay

## 2017-08-09 ENCOUNTER — Encounter (HOSPITAL_COMMUNITY): Payer: Self-pay | Admitting: *Deleted

## 2017-08-09 ENCOUNTER — Emergency Department (HOSPITAL_COMMUNITY)
Admission: EM | Admit: 2017-08-09 | Discharge: 2017-08-09 | Disposition: A | Discharge status: Home / Self Care | Payer: PRIVATE HEALTH INSURANCE | Attending: Emergency Medicine | Admitting: Emergency Medicine

## 2017-08-09 DIAGNOSIS — Z79899 Other long term (current) drug therapy: Secondary | ICD-10-CM | POA: Diagnosis not present

## 2017-08-09 DIAGNOSIS — R21 Rash and other nonspecific skin eruption: Secondary | ICD-10-CM | POA: Diagnosis not present

## 2017-08-09 DIAGNOSIS — Z206 Contact with and (suspected) exposure to human immunodeficiency virus [HIV]: Secondary | ICD-10-CM

## 2017-08-09 DIAGNOSIS — F17210 Nicotine dependence, cigarettes, uncomplicated: Secondary | ICD-10-CM | POA: Insufficient documentation

## 2017-08-09 LAB — COMPREHENSIVE METABOLIC PANEL
ALT: 19 U/L (ref 17–63)
AST: 30 U/L (ref 15–41)
Albumin: 4.1 g/dL (ref 3.5–5.0)
Alkaline Phosphatase: 56 U/L (ref 38–126)
Anion gap: 8 (ref 5–15)
BUN: 12 mg/dL (ref 6–20)
CHLORIDE: 107 mmol/L (ref 101–111)
CO2: 23 mmol/L (ref 22–32)
CREATININE: 1 mg/dL (ref 0.61–1.24)
Calcium: 9.4 mg/dL (ref 8.9–10.3)
GFR calc non Af Amer: >60 mL/min (ref >60)
Glucose, Bld: 92 mg/dL (ref 65–99)
POTASSIUM: 4.6 mmol/L (ref 3.5–5.1)
SODIUM: 138 mmol/L (ref 135–145)
Total Bilirubin: 1.1 mg/dL (ref 0.3–1.2)
Total Protein: 7.2 g/dL (ref 6.5–8.1)

## 2017-08-09 LAB — RAPID HIV SCREEN (HIV 1/2 AB+AG)
HIV 1/2 ANTIBODIES: NONREACTIVE
HIV-1 P24 Antigen - HIV24: NONREACTIVE

## 2017-08-09 MED ORDER — ELVITEG-COBIC-EMTRICIT-TENOFAF 150-150-200-10 MG PO TABS: 1.0000 | ORAL_TABLET | Freq: Every day | ORAL | 0 refills | Status: DC

## 2017-08-09 MED ORDER — TRIAMCINOLONE ACETONIDE 0.1 % EX CREA: 1.0000 "application " | TOPICAL_CREAM | Freq: Two times a day (BID) | CUTANEOUS | 0 refills | Status: DC

## 2017-08-09 MED ORDER — HYDROXYZINE HCL 25 MG PO TABS: 25.0000 mg | ORAL_TABLET | Freq: Four times a day (QID) | ORAL | 0 refills | Status: DC

## 2017-08-09 NOTE — ED Notes (Signed)
Patient Alert and oriented to baseline. Stable and ambulatory to baseline. Patient verbalized understanding of the discharge instructions.  Patient belongings were taken by the patient.   

## 2017-08-09 NOTE — ED Triage Notes (Signed)
Pt reports having a rash on his arms with itching. Rash started today. No acute distress is noted at triage.

## 2017-08-09 NOTE — ED Provider Notes (Signed)
[name] EMERGENCY DEPARTMENT Provider Note   CSN: [id number] Arrival date & time: 08/09/17  1403     History   Chief Complaint Chief Complaint  Patient presents with  . Rash    HPI Mark Boone is a 27 y.o. male who presents to the emergency department with a chief complaint of a rash.  The patient endorses a pruritic rash to the bilateral arms and feet, onset this morning.  He states that the rash started on his arms, but he later noticed it on his feet.  No treatment prior to arrival.  He denies new soaps, detergents, lotions, hygiene products, or sleeping environments.  He denies associated fever, chills, chest pain, abdominal pain, headache, confusion, or painless lesions to the penis.  No known aggravating or alleviating symptoms.  No recent tick exposures.  He is concerned that the rash might be syphilis.  He is currently in a relationship with one male partner.  He states prior to the start of that relationship that he was seen in the emergency department in March for a post exposure prophylaxis for HIV.  He also endorses concern that he may have had a second exposure sometime in the last few weeks.  He states that his current partner is HIV negative, but he a partner since he completed PEP that was likely HIV positive.  The history is provided by the patient. No language interpreter was used.    History reviewed. No pertinent past medical history.  There are no active problems to display for this patient.   History reviewed. No pertinent surgical history.      Home Medications    Prior to Admission medications   Medication Sig Start Date End Date Taking? Authorizing Provider  dolutegravir (TIVICAY) 50 MG tablet Take 1 tablet (50 mg total) by mouth daily. 05/30/17   Kellie Shropshire, PA-C  elvitegravir-cobicistat-emtricitabine-tenofovir (GENVOYA) 150-150-200-10 MG TABS tablet Take 1 tablet by mouth daily with breakfast. 08/09/17   Concetta Guion,  Jennfier Abdulla A, PA-C  elvitegravir-cobicistat-emtricitabine-tenofovir (GENVOYA) 150-150-200-10 MG TABS tablet Take 1 tablet by mouth daily with breakfast. 08/09/17   Lyric Hoar A, PA-C  emtricitabine-tenofovir (TRUVADA) 200-300 MG tablet Take 1 tablet by mouth daily. 05/30/17   Kellie Shropshire, PA-C  hydrOXYzine (ATARAX/VISTARIL) 25 MG tablet Take 1 tablet (25 mg total) by mouth every 6 (six) hours. 08/09/17   Shelena Castelluccio A, PA-C  ibuprofen (ADVIL,MOTRIN) 800 MG tablet Take 1 tablet (800 mg total) by mouth every 8 (eight) hours as needed. 08/19/15   Lawyer, Cristal Deer, PA-C  ondansetron (ZOFRAN ODT) 4 MG disintegrating tablet Take 1 tablet (4 mg total) by mouth every 8 (eight) hours as needed for nausea or vomiting. 05/30/17   Kellie Shropshire, PA-C  triamcinolone cream (KENALOG) 0.1 % Apply 1 application topically 2 (two) times daily. 08/09/17   Jaki Steptoe, Coral Else, PA-C    Family History History reviewed. No pertinent family history.  Social History Social History   Tobacco Use  . Smoking status: Current Every Day Smoker    Types: Cigarettes  . Smokeless tobacco: Never Used  Substance Use Topics  . Alcohol use: Yes  . Drug use: No     Allergies   Patient has no known allergies.   Review of Systems Review of Systems  Constitutional: Negative for appetite change and fever.  Respiratory: Negative for shortness of breath.   Cardiovascular: Negative for chest pain.  Gastrointestinal: Negative for abdominal pain.  Genitourinary: Negative for dysuria.  Musculoskeletal: Negative  for back pain.  Skin: Positive for rash.  Allergic/Immunologic: Negative for immunocompromised state.  Neurological: Negative for headaches.  Psychiatric/Behavioral: Negative for confusion.     Physical Exam Updated Vital Signs BP 133/74 (BP Location: Right Arm)   Pulse 87   Temp 98.9 F (37.2 C) (Oral)   Resp 16   SpO2 97%   Physical Exam  Constitutional: He appears well-developed.  HENT:  Head:  Normocephalic.  Eyes: Conjunctivae are normal.  Neck: Neck supple.  Cardiovascular: Normal rate and regular rhythm.  No murmur heard. Pulmonary/Chest: Effort normal.  Abdominal: Soft. He exhibits no distension.  Neurological: He is alert.  Skin: Skin is warm and dry. Rash noted.  Circular, grouped maculopapular rash to the bilateral arms, hands, and feet.  The rash crosses the dermatomes.  The chest, back, and legs above the patient's sock line are spared.  He is currently wearing a long sleeve shirt with the sleeves rolled up.  The interdigital space of the hands and feet are spared.  No lesions in the mouth.  No vesicles, bulla, induration, fluctuance, desquamation, or scaling.  No tenderness to palpation.  No surrounding erythema, edema, or warmth.  Psychiatric: His behavior is normal.  Nursing note and vitals reviewed.    ED Treatments / Results  Labs (all labs ordered are listed, but only abnormal results are displayed) Labs Reviewed  COMPREHENSIVE METABOLIC PANEL  RAPID HIV SCREEN (HIV 1/2 AB+AG)  HEPATITIS C ANTIBODY  HEPATITIS B SURFACE ANTIGEN  RPR    EKG None  Radiology No results found.  Procedures Procedures (including critical care time)  Medications Ordered in ED Medications - No data to display   Initial Impression / Assessment and Plan / ED Course  I have reviewed the triage vital signs and the nursing notes.  Pertinent labs & imaging results that were available during my care of the patient were reviewed by me and considered in my medical decision making (see chart for details).     27 year old male presenting with a rash.  Grouped, circular maculopapular lesions are present to the bilateral arms, dorsum of the hands, and bilateral feet. Pt has a patent airway without stridor and is handling secretions without difficulty; no angioedema. No blisters, no pustules, no warmth, no draining sinus tracts, no superficial abscesses, no bullous impetigo, no  vesicles, no desquamation, no target lesions with dusky purpura or a central bulla. Not tender to touch. No concern for superimposed infection. No concern for SJS, TEN, TSS, tick borne illness, or other life-threatening condition.  The patient does endorse that he is concerned for syphilis.  He denies any recent painless chancres.  He also is concerned that he may have been exposed to HIV during sexual intercourse recently.  He is requesting PEP.  Labs have been ordered, occluding RPR, and are pending.  I also discussed PrEP with the patient at length and have provided him with a referral to the ID clinic.  We will discharge the patient with Genvoya, hydroxyzine for pruritus, and triamcinolone cream.  Strict return precautions given.  The patient is hemodynamically stable and in no acute distress.  He is safe for discharge home at this time.   Final Clinical Impressions(s) / ED Diagnoses   Final diagnoses:  HIV exposure  Rash and nonspecific skin eruption    ED Discharge Orders        Ordered    elvitegravir-cobicistat-emtricitabine-tenofovir (GENVOYA) 150-150-200-10 MG TABS tablet  Daily with breakfast     08/09/17 1648  elvitegravir-cobicistat-emtricitabine-tenofovir (GENVOYA) 150-150-200-10 MG TABS tablet  Daily with breakfast     08/09/17 1648    hydrOXYzine (ATARAX/VISTARIL) 25 MG tablet  Every 6 hours     08/09/17 1711    triamcinolone cream (KENALOG) 0.1 %  2 times daily     08/09/17 1711       Sander Remedios A, PA-C 08/09/17 Marianna Fuss, MD 08/15/17 [phone]

## 2017-08-09 NOTE — Discharge Instructions (Addendum)
Thank you for allowing me to provide your care today in the emergency department.  Download the MyChart App to see your labs after they result after about 72 hours.   An HIV test was performed today in the emergency department.  It is important that you have a repeat test performed in 6 weeks to check for HIV antibodies and an HIV RNA test performed in 3 to 4 months.  These can all be performed at the infectious disease clinic.  If you are concerned about contracting HIV in the future, I have attached information on prep, preexposure prophylaxis.  This medication can be obtained at the infectious disease clinic.  Apply a thin layer of triamcinolone cream to your rash up to 2 times daily.  Take 1 tablet of Atarax every 6 hours as needed for itching.  If your symptoms worsen after this treatment for 72 hours, or if you develop new concerning symptoms, return to the emergency department for reevaluation.

## 2017-08-09 NOTE — ED Notes (Signed)
ED Provider at bedside. 

## 2017-08-10 LAB — RPR: RPR Ser Ql: NONREACTIVE

## 2017-08-10 LAB — HEPATITIS C ANTIBODY: HCV Ab: 0.5 s/co ratio (ref 0.0–0.9)

## 2017-08-10 LAB — HEPATITIS B SURFACE ANTIGEN: Hepatitis B Surface Ag: NEGATIVE

## 2017-08-13 ENCOUNTER — Telehealth: Payer: Self-pay | Admitting: Pharmacist Clinician (PhC)/ Clinical Pharmacy Specialist

## 2017-08-13 NOTE — Telephone Encounter (Signed)
Talked to Mark Boone about coming to see [name] here for HIV PrEP. He said that he is uninsured. Told repeatedly to bring in 2 pay stubs within the last 90d and a proof of address. Gave him the address to the clinic.

## 2017-08-13 NOTE — Telephone Encounter (Signed)
Thank you. I hope he comes

## 2019-03-13 ENCOUNTER — Other Ambulatory Visit: Payer: Self-pay

## 2019-03-13 ENCOUNTER — Emergency Department (HOSPITAL_COMMUNITY)
Admission: EM | Admit: 2019-03-13 | Discharge: 2019-03-13 | Disposition: A | Discharge status: Home / Self Care | Payer: POS | Attending: Emergency Medicine | Admitting: Emergency Medicine

## 2019-03-13 ENCOUNTER — Encounter (HOSPITAL_COMMUNITY): Payer: Self-pay | Admitting: Emergency Medicine

## 2019-03-13 DIAGNOSIS — Z114 Encounter for screening for human immunodeficiency virus [HIV]: Secondary | ICD-10-CM | POA: Diagnosis not present

## 2019-03-13 DIAGNOSIS — Z206 Contact with and (suspected) exposure to human immunodeficiency virus [HIV]: Secondary | ICD-10-CM

## 2019-03-13 DIAGNOSIS — F17210 Nicotine dependence, cigarettes, uncomplicated: Secondary | ICD-10-CM | POA: Insufficient documentation

## 2019-03-13 DIAGNOSIS — Z113 Encounter for screening for infections with a predominantly sexual mode of transmission: Secondary | ICD-10-CM | POA: Diagnosis present

## 2019-03-13 DIAGNOSIS — Z7721 Contact with and (suspected) exposure to potentially hazardous body fluids: Secondary | ICD-10-CM | POA: Diagnosis not present

## 2019-03-13 LAB — URINALYSIS, ROUTINE W REFLEX MICROSCOPIC
Bilirubin Urine: NEGATIVE
Glucose, UA: NEGATIVE mg/dL
Hgb urine dipstick: NEGATIVE
Ketones, ur: NEGATIVE mg/dL
Leukocytes,Ua: NEGATIVE
Nitrite: NEGATIVE
Protein, ur: NEGATIVE mg/dL
Specific Gravity, Urine: 1.018 (ref 1.005–1.030)
pH: 6 (ref 5.0–8.0)

## 2019-03-13 LAB — HIV ANTIBODY (ROUTINE TESTING W REFLEX): HIV Screen 4th Generation wRfx: NONREACTIVE

## 2019-03-13 MED ORDER — EMTRICITABINE-TENOFOVIR DF 200-300 MG PO TABS: 1.0000 | ORAL_TABLET | Freq: Every day | ORAL | 0 refills | Status: AC

## 2019-03-13 MED ORDER — RALTEGRAVIR POTASSIUM 400 MG PO TABS: 400.0000 mg | ORAL_TABLET | Freq: Two times a day (BID) | ORAL | 0 refills | Status: AC

## 2019-03-13 NOTE — ED Provider Notes (Signed)
[name] EMERGENCY DEPARTMENT Provider Note   CSN: [id number] Arrival date & time: 03/13/19  1951     History Chief Complaint  Patient presents with  . std check    Mark Boone is a 28 y.o. male.  Patient is a 28 y/o male with no PMH presenting to the ER requesting PEP for HIV exposure as well as STD testing. Patient reports that he had a new male sexual partner yesterday. Reports he thinks his partner has HIV and lied to him. He is not having any symptoms. Has been seen in the ER for the same I nthe past and was given PRP. He was also counseled on PrEP and was referred to ID but never followed up.        History reviewed. No pertinent past medical history.  There are no problems to display for this patient.   History reviewed. No pertinent surgical history.     No family history on file.  Social History   Tobacco Use  . Smoking status: Current Every Day Smoker    Types: Cigarettes  . Smokeless tobacco: Never Used  Substance Use Topics  . Alcohol use: Yes  . Drug use: No    Home Medications Prior to Admission medications   Medication Sig Start Date End Date Taking? Authorizing Provider  dolutegravir (TIVICAY) 50 MG tablet Take 1 tablet (50 mg total) by mouth daily. 05/30/17   Kellie Shropshire, PA-C  elvitegravir-cobicistat-emtricitabine-tenofovir (GENVOYA) 150-150-200-10 MG TABS tablet Take 1 tablet by mouth daily with breakfast. 08/09/17   McDonald, Mia A, PA-C  elvitegravir-cobicistat-emtricitabine-tenofovir (GENVOYA) 150-150-200-10 MG TABS tablet Take 1 tablet by mouth daily with breakfast. 08/09/17   McDonald, Mia A, PA-C  emtricitabine-tenofovir (TRUVADA) 200-300 MG tablet Take 1 tablet by mouth daily for 28 days. 03/13/19 04/10/19  Ronnie Doss A, PA-C  hydrOXYzine (ATARAX/VISTARIL) 25 MG tablet Take 1 tablet (25 mg total) by mouth every 6 (six) hours. 08/09/17   McDonald, Mia A, PA-C  ondansetron (ZOFRAN ODT) 4 MG disintegrating  tablet Take 1 tablet (4 mg total) by mouth every 8 (eight) hours as needed for nausea or vomiting. 05/30/17   Kellie Shropshire, PA-C  raltegravir (ISENTRESS) 400 MG tablet Take 1 tablet (400 mg total) by mouth 2 (two) times daily for 28 days. 03/13/19 04/10/19  Ronnie Doss A, PA-C  triamcinolone cream (KENALOG) 0.1 % Apply 1 application topically 2 (two) times daily. 08/09/17   McDonald, Mia A, PA-C    Allergies    Patient has no known allergies.  Review of Systems   Review of Systems  Constitutional: Negative for chills, fever and unexpected weight change.  Respiratory: Negative for cough and shortness of breath.   Gastrointestinal: Negative for abdominal pain, nausea and vomiting.  Genitourinary: Negative.   Musculoskeletal: Negative for arthralgias and myalgias.  Allergic/Immunologic: Negative for immunocompromised state.  Neurological: Negative for dizziness and headaches.    Physical Exam Updated Vital Signs BP (!) 146/81 (BP Location: Left Arm)   Pulse 74   Temp 98.8 F (37.1 C) (Oral)   Resp 16   Ht 6' (1.829 m)   Wt 94.3 kg   SpO2 98%   BMI 28.21 kg/m   Physical Exam Vitals and nursing note reviewed.  Constitutional:      Appearance: Normal appearance.  HENT:     Head: Normocephalic.  Eyes:     Conjunctiva/sclera: Conjunctivae normal.  Pulmonary:     Effort: Pulmonary effort is normal.  Skin:  General: Skin is dry.  Neurological:     Mental Status: He is alert.  Psychiatric:        Mood and Affect: Mood normal.     ED Results / Procedures / Treatments   Labs (all labs ordered are listed, but only abnormal results are displayed) Labs Reviewed  URINALYSIS, ROUTINE W REFLEX MICROSCOPIC  HIV ANTIBODY (ROUTINE TESTING W REFLEX)  GC/CHLAMYDIA PROBE AMP ([place]) NOT AT [facility]    EKG None  Radiology No results found.  Procedures Procedures (including critical care time)  Medications Ordered in ED Medications - No data to display  ED Course   I have reviewed the triage vital signs and the nursing notes.  Pertinent labs & imaging results that were available during my care of the patient were reviewed by me and considered in my medical decision making (see chart for details).  Clinical Course as of Mar 12 2204  Sat Mar 13, 2019  2203 Homosexual male with new sexual partner last night presenting for STD and possible HIV exposure. Given risk factors and it has been <72 hours from exposure will start nPEP. Patient was advised on prevention of HIV rather than relying on PEP. Also advised to f/u with ID and/or health department. UA clear, asymptomatic at this time.    [KM]    Clinical Course User Index [KM] Kristine Royal   MDM Rules/Calculators/A&P                      Based on review of vitals, medical screening exam, lab work and/or imaging, there does not appear to be an acute, emergent etiology for the patient's symptoms. Counseled pt on good return precautions and encouraged both PCP and ED follow-up as needed.  Prior to discharge, I also discussed incidental imaging findings with patient in detail and advised appropriate, recommended follow-up in detail.  Clinical Impression: 1. HIV exposure from body fluids     Disposition: Discharge  Prior to providing a prescription for a controlled substance, I independently reviewed the patient's recent prescription history on the [facility]. The patient had no recent or regular prescriptions and was deemed appropriate for a brief, less than 3 day prescription of narcotic for acute analgesia.  This note was prepared with assistance of Systems analyst. Occasional wrong-word or sound-a-like substitutions may have occurred due to the inherent limitations of voice recognition software.  Final Clinical Impression(s) / ED Diagnoses Final diagnoses:  HIV exposure from body fluids    Rx / DC Orders ED Discharge Orders          Ordered    emtricitabine-tenofovir (TRUVADA) 200-300 MG tablet  Daily     03/13/19 2120    raltegravir (ISENTRESS) 400 MG tablet  2 times daily     03/13/19 2120           Kristine Royal 03/13/19 2205    Sherwood Gambler, MD 03/14/19 1521

## 2019-03-13 NOTE — ED Triage Notes (Signed)
Pt st's he had unprotected sex and want's to be checked for STD's and HIV

## 2019-03-13 NOTE — Discharge Instructions (Signed)
You were seen today for possible STD and HIV exposure. Your STD tests are currently pending and you should avoid any sexual intercourse until you know your results are negative.  You should follow-up with the health department or infectious disease doctors specifically to speak to them about preexposure prophylaxis of HIV for the future. The most effective methods for preventing HIV infection are those that protect against exposure. Antiretroviral therapy cannot replace behaviors that help avoid HIV exposure (such as sexual abstinence, sex only in a mutually monogamous relationship with an HIV-uninfected partner, consistent and correct condom use). Starting PEP after an exposure is LESS effective at preventing HIV infection than avoiding exposure

## 2019-05-25 ENCOUNTER — Emergency Department (HOSPITAL_COMMUNITY)
Admission: EM | Admit: 2019-05-25 | Discharge: 2019-05-25 | Disposition: A | Discharge status: Home / Self Care | Payer: POS | Attending: Emergency Medicine | Admitting: Emergency Medicine

## 2019-05-25 ENCOUNTER — Other Ambulatory Visit: Payer: Self-pay

## 2019-05-25 ENCOUNTER — Encounter (HOSPITAL_COMMUNITY): Payer: Self-pay | Admitting: Emergency Medicine

## 2019-05-25 DIAGNOSIS — Z202 Contact with and (suspected) exposure to infections with a predominantly sexual mode of transmission: Secondary | ICD-10-CM | POA: Insufficient documentation

## 2019-05-25 DIAGNOSIS — Z114 Encounter for screening for human immunodeficiency virus [HIV]: Secondary | ICD-10-CM | POA: Insufficient documentation

## 2019-05-25 DIAGNOSIS — F17210 Nicotine dependence, cigarettes, uncomplicated: Secondary | ICD-10-CM | POA: Insufficient documentation

## 2019-05-25 LAB — HIV ANTIBODY (ROUTINE TESTING W REFLEX): HIV Screen 4th Generation wRfx: NONREACTIVE

## 2019-05-25 MED ORDER — AZITHROMYCIN 250 MG PO TABS
1000.0000 mg | ORAL_TABLET | Freq: Once | ORAL | Status: AC
  Administered 2019-05-25: 1000 mg via ORAL
  Filled 2019-05-25: qty 4

## 2019-05-25 MED ORDER — EMTRICITABINE-TENOFOVIR DF 200-300 MG PO TABS: 1.0000 | ORAL_TABLET | Freq: Every day | ORAL | 0 refills | Status: DC

## 2019-05-25 MED ORDER — RALTEGRAVIR POTASSIUM 400 MG PO TABS: 400.0000 mg | ORAL_TABLET | Freq: Two times a day (BID) | ORAL | 0 refills | Status: DC

## 2019-05-25 MED ORDER — CEFTRIAXONE SODIUM 500 MG IJ SOLR
500.0000 mg | Freq: Once | INTRAMUSCULAR | Status: AC
  Administered 2019-05-25: 500 mg via INTRAMUSCULAR
  Filled 2019-05-25: qty 500

## 2019-05-25 NOTE — ED Notes (Signed)
Pt was discharged from the ED. Pt read and understood discharge paperwork. Pt had vital signs completed. Pt conscious, breathing, and A&Ox4. No distress noted. Pt speaking in complete sentences. Pt ambulated out of the ED with a smooth and steady gait. E-signature not available.  

## 2019-05-25 NOTE — ED Triage Notes (Signed)
Pt reports possible exposure to HIV. Denies any pain or symptoms.

## 2019-05-25 NOTE — ED Provider Notes (Signed)
[name] EMERGENCY DEPARTMENT Provider Note   CSN: [id number] Arrival date & time: 05/25/19  1833     History Chief Complaint  Patient presents with  . hiv exposure    Mark Boone is a 29 y.o. male.  HPI Patient presents with concern of possible exposure to STD. Patient has new male partner, had unprotected sex yesterday.  He has no history of HIV, has previously been tested for and received postexposure prophylaxis.  He completed a course of that medication about 5 weeks ago.  He notes that he is otherwise generally well, denies fever, denies pain, denies dysuria, denies focal complaints.  With the unknown circumstances around his new partner he requests evaluation and treatment.    History reviewed. No pertinent past medical history.  There are no problems to display for this patient.   History reviewed. No pertinent surgical history.     No family history on file.  Social History   Tobacco Use  . Smoking status: Current Every Day Smoker    Types: Cigarettes  . Smokeless tobacco: Never Used  Substance Use Topics  . Alcohol use: Yes  . Drug use: No    Home Medications Prior to Admission medications   Medication Sig Start Date End Date Taking? Authorizing Provider  dolutegravir (TIVICAY) 50 MG tablet Take 1 tablet (50 mg total) by mouth daily. 05/30/17   Kellie Shropshire, PA-C  elvitegravir-cobicistat-emtricitabine-tenofovir (GENVOYA) 150-150-200-10 MG TABS tablet Take 1 tablet by mouth daily with breakfast. 08/09/17   McDonald, Mia A, PA-C  elvitegravir-cobicistat-emtricitabine-tenofovir (GENVOYA) 150-150-200-10 MG TABS tablet Take 1 tablet by mouth daily with breakfast. 08/09/17   McDonald, Mia A, PA-C  hydrOXYzine (ATARAX/VISTARIL) 25 MG tablet Take 1 tablet (25 mg total) by mouth every 6 (six) hours. 08/09/17   McDonald, Mia A, PA-C  ondansetron (ZOFRAN ODT) 4 MG disintegrating tablet Take 1 tablet (4 mg total) by mouth every 8 (eight)  hours as needed for nausea or vomiting. 05/30/17   Kellie Shropshire, PA-C  triamcinolone cream (KENALOG) 0.1 % Apply 1 application topically 2 (two) times daily. 08/09/17   McDonald, Mia A, PA-C    Allergies    Patient has no known allergies.  Review of Systems   Review of Systems  Constitutional:       Per HPI, otherwise negative  HENT:       Per HPI, otherwise negative  Respiratory:       Per HPI, otherwise negative  Cardiovascular:       Per HPI, otherwise negative  Gastrointestinal: Negative for vomiting.  Endocrine:       Negative aside from HPI  Genitourinary:       Neg aside from HPI   Musculoskeletal:       Per HPI, otherwise negative  Skin: Negative.   Allergic/Immunologic: Negative for immunocompromised state.  Neurological: Negative for syncope.    Physical Exam Updated Vital Signs BP (!) 143/99   Pulse 65   Temp 98.2 F (36.8 C) (Oral)   Resp 16   Ht 6' (1.829 m)   Wt 94.3 kg   SpO2 100%   BMI 28.21 kg/m   Physical Exam Vitals and nursing note reviewed.  Constitutional:      General: He is not in acute distress.    Appearance: He is well-developed.  HENT:     Head: Normocephalic and atraumatic.  Eyes:     Conjunctiva/sclera: Conjunctivae normal.  Pulmonary:     Effort: Pulmonary effort is normal.  No respiratory distress.     Breath sounds: No stridor.  Skin:    General: Skin is warm and dry.  Neurological:     Mental Status: He is alert and oriented to person, place, and time.  Psychiatric:        Mood and Affect: Mood normal.        Behavior: Behavior normal.        Thought Content: Thought content normal.     ED Results / Procedures / Treatments   Labs (all labs ordered are listed, but only abnormal results are displayed) Labs Reviewed  RPR  HIV ANTIBODY (ROUTINE TESTING W REFLEX)  GC/CHLAMYDIA PROBE AMP ([place]) NOT AT [facility]     Procedures Procedures (including critical care time)  Medications Ordered in ED Medications    cefTRIAXone (ROCEPHIN) injection 500 mg (has no administration in time range)  azithromycin (ZITHROMAX) tablet 1,000 mg (has no administration in time range)    ED Course  I have reviewed the triage vital signs and the nursing notes.  Pertinent labs & imaging results that were available during my care of the patient were reviewed by me and considered in my medical decision making (see chart for details).  After the initial evaluation we discussed implications of his recent sexual encounter, unprotected, male on male sex.  Patient has no history of HIV, denies of medical problems, and we discussed testing indications, and postexposure prophylaxis including HIV, gonorrhea, chlamydia, all of which the patient is amenable to.  On chart review is clear that the patient received this medication about 2 and half months ago, will receive shots, tablets, prescriptions as indicated.  Absent current physical complaints, hemodynamic instability, patient is appropriate for follow-up with labs to result over the coming day. Final Clinical Impression(s) / ED Diagnoses Final diagnoses:  STD exposure    Rx / DC Orders ED Discharge Orders         Ordered    emtricitabine-tenofovir (TRUVADA) 200-300 MG tablet  Daily     05/25/19 2206    raltegravir (ISENTRESS) 400 MG tablet  2 times daily     05/25/19 2206           Carmin Muskrat, MD 05/25/19 2207

## 2019-05-25 NOTE — Discharge Instructions (Signed)
As discussed, your evaluation today has been largely reassuring.  But, it is important that you monitor your condition carefully, and do not hesitate to return to the ED if you develop new, or concerning changes in your condition.  Otherwise, please follow-up with our affiliated clinic for appropriate ongoing management of your concerns.

## 2019-05-26 LAB — RPR: RPR Ser Ql: NONREACTIVE

## 2019-05-27 LAB — GC/CHLAMYDIA PROBE AMP (~~LOC~~) NOT AT ARMC
Chlamydia: NEGATIVE
Neisseria Gonorrhea: NEGATIVE

## 2019-08-27 ENCOUNTER — Encounter (HOSPITAL_COMMUNITY): Payer: Self-pay | Admitting: Emergency Medicine

## 2019-08-27 ENCOUNTER — Other Ambulatory Visit: Payer: Self-pay

## 2019-08-27 ENCOUNTER — Emergency Department (HOSPITAL_COMMUNITY)
Admission: EM | Admit: 2019-08-27 | Discharge: 2019-08-27 | Disposition: A | Discharge status: Home / Self Care | Payer: PRIVATE HEALTH INSURANCE | Attending: Emergency Medicine | Admitting: Emergency Medicine

## 2019-08-27 DIAGNOSIS — Z7251 High risk heterosexual behavior: Secondary | ICD-10-CM | POA: Diagnosis not present

## 2019-08-27 DIAGNOSIS — Z202 Contact with and (suspected) exposure to infections with a predominantly sexual mode of transmission: Secondary | ICD-10-CM | POA: Diagnosis present

## 2019-08-27 DIAGNOSIS — F17210 Nicotine dependence, cigarettes, uncomplicated: Secondary | ICD-10-CM | POA: Insufficient documentation

## 2019-08-27 DIAGNOSIS — Z79899 Other long term (current) drug therapy: Secondary | ICD-10-CM | POA: Diagnosis not present

## 2019-08-27 DIAGNOSIS — Z206 Contact with and (suspected) exposure to human immunodeficiency virus [HIV]: Secondary | ICD-10-CM | POA: Diagnosis not present

## 2019-08-27 LAB — RAPID HIV SCREEN (HIV 1/2 AB+AG)
HIV 1/2 Antibodies: NONREACTIVE
HIV-1 P24 Antigen - HIV24: NONREACTIVE

## 2019-08-27 MED ORDER — RALTEGRAVIR POTASSIUM 400 MG PO TABS: 400.0000 mg | ORAL_TABLET | Freq: Two times a day (BID) | ORAL | 0 refills | Status: DC

## 2019-08-27 MED ORDER — EMTRICITABINE-TENOFOVIR DF 200-300 MG PO TABS
1.0000 | ORAL_TABLET | Freq: Every day | ORAL | 0 refills | Status: DC
Start: 2019-08-27 — End: 2019-12-23

## 2019-08-27 NOTE — ED Provider Notes (Signed)
[facility] EMERGENCY DEPARTMENT Provider Note   CSN: [id number] Arrival date & time: 08/27/19  8676     History Chief Complaint  Patient presents with  . Exposure to STD    Mark Boone is a 29 y.o. male.  HPI   29 year old male presenting requesting postexposure prophylaxis for HIV.  He had unprotected anal intercourse yesterday.  He is unsure about the other person's medical history.  He has no other acute complaints. He says he is trying to get on PREP.   History reviewed. No pertinent past medical history.  There are no problems to display for this patient.   History reviewed. No pertinent surgical history.     History reviewed. No pertinent family history.  Social History   Tobacco Use  . Smoking status: Current Every Day Smoker    Types: Cigarettes  . Smokeless tobacco: Never Used  Substance Use Topics  . Alcohol use: Yes  . Drug use: No    Home Medications Prior to Admission medications   Medication Sig Start Date End Date Taking? Authorizing Provider  dolutegravir (TIVICAY) 50 MG tablet Take 1 tablet (50 mg total) by mouth daily. 05/30/17   Kellie Shropshire, PA-C  elvitegravir-cobicistat-emtricitabine-tenofovir (GENVOYA) 150-150-200-10 MG TABS tablet Take 1 tablet by mouth daily with breakfast. 08/09/17   McDonald, Mia A, PA-C  elvitegravir-cobicistat-emtricitabine-tenofovir (GENVOYA) 150-150-200-10 MG TABS tablet Take 1 tablet by mouth daily with breakfast. 08/09/17   McDonald, Mia A, PA-C  emtricitabine-tenofovir (TRUVADA) 200-300 MG tablet Take 1 tablet by mouth daily. 08/27/19   Raeford Razor, MD  hydrOXYzine (ATARAX/VISTARIL) 25 MG tablet Take 1 tablet (25 mg total) by mouth every 6 (six) hours. 08/09/17   McDonald, Mia A, PA-C  ondansetron (ZOFRAN ODT) 4 MG disintegrating tablet Take 1 tablet (4 mg total) by mouth every 8 (eight) hours as needed for nausea or vomiting. 05/30/17   Kellie Shropshire, PA-C  raltegravir (ISENTRESS)  400 MG tablet Take 1 tablet (400 mg total) by mouth 2 (two) times daily. 08/27/19   Raeford Razor, MD  triamcinolone cream (KENALOG) 0.1 % Apply 1 application topically 2 (two) times daily. 08/09/17   McDonald, Mia A, PA-C    Allergies    Patient has no known allergies.  Review of Systems   Review of Systems All systems reviewed and negative, other than as noted in HPI.  Physical Exam Updated Vital Signs BP 127/79   Pulse 94   Temp 99.4 F (37.4 C) (Oral)   Resp 16   Ht 6' (1.829 m)   Wt 89.8 kg   SpO2 92%   BMI 26.85 kg/m   Physical Exam Vitals and nursing note reviewed.  Constitutional:      General: He is not in acute distress.    Appearance: He is well-developed.  HENT:     Head: Normocephalic and atraumatic.  Eyes:     General:        Right eye: No discharge.        Left eye: No discharge.     Conjunctiva/sclera: Conjunctivae normal.  Cardiovascular:     Rate and Rhythm: Normal rate and regular rhythm.     Heart sounds: Normal heart sounds. No murmur heard.  No friction rub. No gallop.   Pulmonary:     Effort: Pulmonary effort is normal. No respiratory distress.     Breath sounds: Normal breath sounds.  Abdominal:     General: There is no distension.     Palpations: Abdomen  is soft.     Tenderness: There is no abdominal tenderness.  Musculoskeletal:        General: No tenderness.     Cervical back: Neck supple.  Skin:    General: Skin is warm and dry.  Neurological:     Mental Status: He is alert.  Psychiatric:        Behavior: Behavior normal.        Thought Content: Thought content normal.     ED Results / Procedures / Treatments   Labs (all labs ordered are listed, but only abnormal results are displayed) Labs Reviewed  RAPID HIV SCREEN (HIV 1/2 AB+AG)    EKG None  Radiology No results found.  Procedures Procedures (including critical care time)  Medications Ordered in ED Medications - No data to display  ED Course  I have  reviewed the triage vital signs and the nursing notes.  Pertinent labs & imaging results that were available during my care of the patient were reviewed by me and considered in my medical decision making (see chart for details).    MDM Rules/Calculators/A&P                          29 year old male requesting postexposure prophylaxis for HIV.  He has no complaints otherwise.  This is the fifth or sixth time he has been seen in the emergency room over the last couple years.  He continues to engage in high risk sexual behavior.  He was counseled on this.  Discussed the need to use protection consistently.  Discussed the need to follow-up if he is truly interested in adhering to PREP.   Final Clinical Impression(s) / ED Diagnoses Final diagnoses:  HIV exposure  High risk sexual behavior, unspecified type    Rx / DC Orders ED Discharge Orders         Ordered    raltegravir (ISENTRESS) 400 MG tablet  2 times daily     Discontinue  Reprint     08/27/19 0832    emtricitabine-tenofovir (TRUVADA) 200-300 MG tablet  Daily     Discontinue  Reprint     08/27/19 6945           Virgel Manifold, MD 08/27/19 0840

## 2019-08-27 NOTE — ED Triage Notes (Signed)
Patient arrives to ED with complaints of having unprotected sex yesterday with someone who may of had HIV.

## 2019-12-23 ENCOUNTER — Emergency Department (HOSPITAL_COMMUNITY)
Admission: EM | Admit: 2019-12-23 | Discharge: 2019-12-23 | Disposition: A | Discharge status: Home / Self Care | Payer: PRIVATE HEALTH INSURANCE | Attending: Emergency Medicine | Admitting: Emergency Medicine

## 2019-12-23 ENCOUNTER — Encounter (HOSPITAL_COMMUNITY): Payer: Self-pay

## 2019-12-23 DIAGNOSIS — F17210 Nicotine dependence, cigarettes, uncomplicated: Secondary | ICD-10-CM | POA: Insufficient documentation

## 2019-12-23 DIAGNOSIS — Z711 Person with feared health complaint in whom no diagnosis is made: Secondary | ICD-10-CM

## 2019-12-23 DIAGNOSIS — Z202 Contact with and (suspected) exposure to infections with a predominantly sexual mode of transmission: Secondary | ICD-10-CM | POA: Insufficient documentation

## 2019-12-23 LAB — RAPID HIV SCREEN (HIV 1/2 AB+AG)
HIV 1/2 Antibodies: NONREACTIVE
HIV-1 P24 Antigen - HIV24: NONREACTIVE

## 2019-12-23 LAB — URINALYSIS, ROUTINE W REFLEX MICROSCOPIC
Bilirubin Urine: NEGATIVE
Glucose, UA: NEGATIVE mg/dL
Hgb urine dipstick: NEGATIVE
Ketones, ur: 5 mg/dL — AB
Leukocytes,Ua: NEGATIVE
Nitrite: NEGATIVE
Protein, ur: NEGATIVE mg/dL
Specific Gravity, Urine: 1.023 (ref 1.005–1.030)
pH: 6 (ref 5.0–8.0)

## 2019-12-23 LAB — HIV ANTIBODY (ROUTINE TESTING W REFLEX): HIV Screen 4th Generation wRfx: NONREACTIVE

## 2019-12-23 MED ORDER — RALTEGRAVIR POTASSIUM 400 MG PO TABS: 400.0000 mg | ORAL_TABLET | Freq: Two times a day (BID) | ORAL | 0 refills | Status: DC

## 2019-12-23 MED ORDER — DOXYCYCLINE HYCLATE 100 MG PO TABS
100.0000 mg | ORAL_TABLET | Freq: Once | ORAL | Status: AC
  Administered 2019-12-23: 100 mg via ORAL
  Filled 2019-12-23: qty 1

## 2019-12-23 MED ORDER — STERILE WATER FOR INJECTION IJ SOLN
INTRAMUSCULAR | Status: AC
  Filled 2019-12-23: qty 10

## 2019-12-23 MED ORDER — DOXYCYCLINE HYCLATE 100 MG PO CAPS
100.0000 mg | ORAL_CAPSULE | Freq: Two times a day (BID) | ORAL | 0 refills | Status: AC
Start: 2019-12-23 — End: 2019-12-30

## 2019-12-23 MED ORDER — CEFTRIAXONE SODIUM 1 G IJ SOLR
500.0000 mg | Freq: Once | INTRAMUSCULAR | Status: AC
  Administered 2019-12-23: 500 mg via INTRAMUSCULAR
  Filled 2019-12-23: qty 10

## 2019-12-23 MED ORDER — EMTRICITABINE-TENOFOVIR DF 200-300 MG PO TABS
1.0000 | ORAL_TABLET | Freq: Every day | ORAL | 0 refills | Status: DC
Start: 2019-12-23 — End: 2020-07-05

## 2019-12-23 NOTE — ED Provider Notes (Signed)
[facility]-EMERGENCY DEPT Provider Note   CSN: [id number] Arrival date & time: 12/23/19  1322     History Chief Complaint  Patient presents with  . Exposure to STD    Mark Boone is a 29 y.o. male presents today for evaluation of wanting treatment for STD.  Patient reports that he recently started seeing a new partner.  He reports 1 partner that is male.  They have engaged in anal intercourse.  He states that he is uncertain about his partners history and wants to be tested for STDs.  He states he is also concerned about HIV and we want to provide any medication.  He states he is not having any symptoms but wants preventative medication.  He has not had any dysuria, hematuria, fevers, abdominal pain, testicular pain or swelling, rash.  The history is provided by the patient.       History reviewed. No pertinent past medical history.  There are no problems to display for this patient.   History reviewed. No pertinent surgical history.     History reviewed. No pertinent family history.  Social History   Tobacco Use  . Smoking status: Current Every Day Smoker    Types: Cigarettes  . Smokeless tobacco: Never Used  Substance Use Topics  . Alcohol use: Yes  . Drug use: No    Home Medications Prior to Admission medications   Medication Sig Start Date End Date Taking? Authorizing Provider  dolutegravir (TIVICAY) 50 MG tablet Take 1 tablet (50 mg total) by mouth daily. 05/30/17   Kellie Shropshire, PA-C  doxycycline (VIBRAMYCIN) 100 MG capsule Take 1 capsule (100 mg total) by mouth 2 (two) times daily for 7 days. 12/23/19 12/30/19  Maxwell Caul, PA-C  elvitegravir-cobicistat-emtricitabine-tenofovir (GENVOYA) 150-150-200-10 MG TABS tablet Take 1 tablet by mouth daily with breakfast. 08/09/17   McDonald, Mia A, PA-C  elvitegravir-cobicistat-emtricitabine-tenofovir (GENVOYA) 150-150-200-10 MG TABS tablet Take 1 tablet by mouth daily with breakfast.  08/09/17   McDonald, Mia A, PA-C  emtricitabine-tenofovir (TRUVADA) 200-300 MG tablet Take 1 tablet by mouth daily. 12/23/19   Maxwell Caul, PA-C  hydrOXYzine (ATARAX/VISTARIL) 25 MG tablet Take 1 tablet (25 mg total) by mouth every 6 (six) hours. 08/09/17   McDonald, Mia A, PA-C  ondansetron (ZOFRAN ODT) 4 MG disintegrating tablet Take 1 tablet (4 mg total) by mouth every 8 (eight) hours as needed for nausea or vomiting. 05/30/17   Kellie Shropshire, PA-C  raltegravir (ISENTRESS) 400 MG tablet Take 1 tablet (400 mg total) by mouth 2 (two) times daily. 12/23/19   Maxwell Caul, PA-C  triamcinolone cream (KENALOG) 0.1 % Apply 1 application topically 2 (two) times daily. 08/09/17   McDonald, Mia A, PA-C    Allergies    Patient has no known allergies.  Review of Systems   Review of Systems  Constitutional: Negative for fever.  Gastrointestinal: Negative for abdominal pain, nausea and vomiting.  Genitourinary: Negative for discharge, dysuria, genital sores, hematuria, penile pain, scrotal swelling and testicular pain.  All other systems reviewed and are negative.   Physical Exam Updated Vital Signs BP 135/80 (BP Location: Right Arm)   Pulse 70   Temp 98.2 F (36.8 C) (Oral)   Resp 16   SpO2 100%   Physical Exam Vitals and nursing note reviewed. Exam conducted with a chaperone present.  Constitutional:      Appearance: He is well-developed.  HENT:     Head: Normocephalic and atraumatic.  Eyes:  General: No scleral icterus.       Right eye: No discharge.        Left eye: No discharge.     Conjunctiva/sclera: Conjunctivae normal.  Pulmonary:     Effort: Pulmonary effort is normal.  Abdominal:     Comments: Abdomen is soft, non-distended, non-tender. No rigidity, No guarding. No peritoneal signs.  Genitourinary:    Comments: The exam was performed with a chaperone present (RN Melonie [place]). Normal male genitalia. No evidence of rash, ulcers or lesions. No testicular  tenderness, warmth, erythema, edema.  Skin:    General: Skin is warm and dry.  Neurological:     Mental Status: He is alert.  Psychiatric:        Speech: Speech normal.        Behavior: Behavior normal.     ED Results / Procedures / Treatments   Labs (all labs ordered are listed, but only abnormal results are displayed) Labs Reviewed  URINALYSIS, ROUTINE W REFLEX MICROSCOPIC - Abnormal; Notable for the following components:      Result Value   Ketones, ur 5 (*)    All other components within normal limits  RAPID HIV SCREEN (HIV 1/2 AB+AG)  HIV ANTIBODY (ROUTINE TESTING W REFLEX)  GC/CHLAMYDIA PROBE AMP ([place]) NOT AT [facility]    EKG None  Radiology No results found.  Procedures Procedures (including critical care time)  Medications Ordered in ED Medications  sterile water (preservative free) injection (has no administration in time range)  cefTRIAXone (ROCEPHIN) injection 500 mg (500 mg Intramuscular Given 12/23/19 1541)  doxycycline (VIBRA-TABS) tablet 100 mg (100 mg Oral Given 12/23/19 1541)    ED Course  I have reviewed the triage vital signs and the nursing notes.  Pertinent labs & imaging results that were available during my care of the patient were reviewed by me and considered in my medical decision making (see chart for details).    MDM Rules/Calculators/A&P                           29 year old male who presents for evaluation of wanting to be tested for STDs.  He reports that he started seeing a new partner and he is " uncertain" about his partner's history.  They do not use protection.  He is requesting PREP.  He denies any symptoms.  On initially arrival, he is afebrile nontoxic-appearing.  Vital signs are stable.  On exam, no abdominal tenderness.  GU exam unremarkable. No testicular pain or tenderness.  Patient is requesting testing for gonorrhea/committee as well as HIV and syphilis.  Patient has had several visits to the last year for similar.  He  does not have any primary care doctor follow-up.  He is requesting preventative HIV medication.  I also discussed with him regarding gonorrhea and Chlamydia cultures and that they will be back in 2 days.  He would like to go ahead and be treated.  Patient with no known drug allergies. At this time, patient exhibits no emergent life-threatening condition that require further evaluation in ED. Patient had ample opportunity for questions and discussion. All patient's questions were answered with full understanding. Strict return precautions discussed. Patient expresses understanding and agreement to plan.   Portions of this note were generated with Scientist, clinical (histocompatibility and immunogenetics). Dictation errors may occur despite best attempts at proofreading.   Final Clinical Impression(s) / ED Diagnoses Final diagnoses:  Concern about STD in male without diagnosis  Rx / DC Orders ED Discharge Orders         Ordered    emtricitabine-tenofovir (TRUVADA) 200-300 MG tablet  Daily        12/23/19 1528    raltegravir (ISENTRESS) 400 MG tablet  2 times daily        12/23/19 1528    doxycycline (VIBRAMYCIN) 100 MG capsule  2 times daily        12/23/19 1528           Rosana Hoes 12/23/19 1623    Mancel Bale, MD 12/23/19 1718

## 2019-12-23 NOTE — Discharge Instructions (Signed)
Take medications as directed.   You should establish with a primary care doctor or the [facility].   The test results with take 2-3 days to return. If there is an abnormal result, you will be notified. If you do not hear anything, that means the results were negative. You can also log on MyChart to see the results.   Your sexual partner needs to be treated too. Do not have sexual intercourse for the next 7 days and after your partner has been treated.   Follow-up with your primary care doctor in 2-4 days. If you do not have a primary care doctor, you can use one listed in the paperwork.   Return to the Emergency Department for any fever, abdominal pain, difficulty breathing, nausea/vomiting or any other worsening or concerning symptoms.

## 2019-12-23 NOTE — ED Triage Notes (Signed)
Pt presents with c/o possible exposure to STD. Pt reports he had an "uncertain" sex partner. Pt does not have any symptoms but is concerned about HIV.

## 2019-12-24 LAB — GC/CHLAMYDIA PROBE AMP (~~LOC~~) NOT AT ARMC
Chlamydia: NEGATIVE
Comment: NEGATIVE
Comment: NORMAL
Neisseria Gonorrhea: NEGATIVE

## 2020-03-10 ENCOUNTER — Encounter (HOSPITAL_COMMUNITY): Payer: Self-pay | Admitting: Emergency Medicine

## 2020-03-10 ENCOUNTER — Other Ambulatory Visit: Payer: Self-pay

## 2020-03-10 ENCOUNTER — Emergency Department (HOSPITAL_COMMUNITY)
Admission: EM | Admit: 2020-03-10 | Discharge: 2020-03-11 | Disposition: A | Discharge status: Home / Self Care | Payer: POS | Attending: Emergency Medicine | Admitting: Emergency Medicine

## 2020-03-10 DIAGNOSIS — Z206 Contact with and (suspected) exposure to human immunodeficiency virus [HIV]: Secondary | ICD-10-CM | POA: Insufficient documentation

## 2020-03-10 DIAGNOSIS — Z79899 Other long term (current) drug therapy: Secondary | ICD-10-CM

## 2020-03-10 DIAGNOSIS — F17210 Nicotine dependence, cigarettes, uncomplicated: Secondary | ICD-10-CM | POA: Insufficient documentation

## 2020-03-10 LAB — COMPREHENSIVE METABOLIC PANEL
ALT: 17 U/L (ref 0–44)
AST: 25 U/L (ref 15–41)
Albumin: 4.6 g/dL (ref 3.5–5.0)
Alkaline Phosphatase: 50 U/L (ref 38–126)
Anion gap: 9 (ref 5–15)
BUN: 12 mg/dL (ref 6–20)
CO2: 25 mmol/L (ref 22–32)
Calcium: 9.4 mg/dL (ref 8.9–10.3)
Chloride: 104 mmol/L (ref 98–111)
Creatinine, Ser: 0.87 mg/dL (ref 0.61–1.24)
GFR, Estimated: >60 mL/min (ref >60)
Glucose, Bld: 109 mg/dL — ABNORMAL HIGH (ref 70–99)
Potassium: 3.7 mmol/L (ref 3.5–5.1)
Sodium: 138 mmol/L (ref 135–145)
Total Bilirubin: 0.5 mg/dL (ref 0.3–1.2)
Total Protein: 7.7 g/dL (ref 6.5–8.1)

## 2020-03-10 LAB — CBC
HCT: 46.2 % (ref 39.0–52.0)
Hemoglobin: 15.5 g/dL (ref 13.0–17.0)
MCH: 32.9 pg (ref 26.0–34.0)
MCHC: 33.5 g/dL (ref 30.0–36.0)
MCV: 98.1 fL (ref 80.0–100.0)
Platelets: 149 10*3/uL — ABNORMAL LOW (ref 150–400)
RBC: 4.71 MIL/uL (ref 4.22–5.81)
RDW: 11.3 % — ABNORMAL LOW (ref 11.5–15.5)
WBC: 3 10*3/uL — ABNORMAL LOW (ref 4.0–10.5)
nRBC: 0 % (ref 0.0–0.2)

## 2020-03-10 NOTE — ED Triage Notes (Signed)
Patient states he has a single partner and believes he was exposed to HIV yesterday and is requesting prophylactic treatment. No symptoms.

## 2020-03-11 LAB — HIV ANTIBODY (ROUTINE TESTING W REFLEX): HIV Screen 4th Generation wRfx: NONREACTIVE

## 2020-03-11 LAB — RPR: RPR Ser Ql: NONREACTIVE

## 2020-03-11 MED ORDER — ELVITEG-COBIC-EMTRICIT-TENOFAF 150-150-200-10 MG PREPACK
1.0000 | ORAL_TABLET | Freq: Once | ORAL | Status: AC
  Administered 2020-03-11: 1 via ORAL
  Filled 2020-03-11: qty 1

## 2020-03-11 MED ORDER — ELVITEG-COBIC-EMTRICIT-TENOFAF 150-150-200-10 MG PO TABS: 1.0000 | ORAL_TABLET | Freq: Every day | ORAL | 0 refills | Status: DC

## 2020-03-11 NOTE — Discharge Instructions (Addendum)
Your test for HIV, syphilis, gonorrhea, chlamydia are pending.  If positive, you will receive a phone call.  If negative, you will not.  Either way, you may check online MyChart. Follow-up with the infectious disease clinic listed below for further management of prep therapy. Return to the emergency room with any new or worsening, concerning symptoms

## 2020-03-11 NOTE — ED Provider Notes (Signed)
[name]-EMERGENCY DEPT Provider Note   CSN: [id number] Arrival date & time: 03/10/20  2214     History Chief Complaint  Patient presents with  . Post-Exposure Prophylaxis    Mark Boone is a 29 y.o. male presenting for concerns for possible exposure to HIV.  Patient states he was recently sexually active with a new partner and did not use condoms.  He is concerned about possible HIV and other STD exposure.  He has not discussed with the partner his HIV status.  Patient denies any symptoms, no fevers, chills, nausea, vomiting abdominal pain, urinary symptoms, penile discharge, or genital lesions/ulcerations.  He has no medical problems, takes medications daily.  Additional history taken chart reviewed.  Patient has been seen multiple times in the past several years for similar concerns, given prep therapy.  He has not yet followed up with PCP/ID.  HPI     History reviewed. No pertinent past medical history.  There are no problems to display for this patient.   History reviewed. No pertinent surgical history.     No family history on file.  Social History   Tobacco Use  . Smoking status: Current Every Day Smoker    Types: Cigarettes  . Smokeless tobacco: Never Used  Substance Use Topics  . Alcohol use: Yes  . Drug use: No    Home Medications Prior to Admission medications   Medication Sig Start Date End Date Taking? Authorizing Provider  dolutegravir (TIVICAY) 50 MG tablet Take 1 tablet (50 mg total) by mouth daily. 05/30/17   Kellie Shropshire, PA-C  elvitegravir-cobicistat-emtricitabine-tenofovir (GENVOYA) 150-150-200-10 MG TABS tablet Take 1 tablet by mouth daily with breakfast. 08/09/17   McDonald, Mia A, PA-C  elvitegravir-cobicistat-emtricitabine-tenofovir (GENVOYA) 150-150-200-10 MG TABS tablet Take 1 tablet by mouth daily with breakfast. 08/09/17   McDonald, Mia A, PA-C  elvitegravir-cobicistat-emtricitabine-tenofovir (GENVOYA)  150-150-200-10 MG TABS tablet Take 1 tablet by mouth daily with breakfast. 03/11/20   Kenlea Woodell, PA-C  emtricitabine-tenofovir (TRUVADA) 200-300 MG tablet Take 1 tablet by mouth daily. 12/23/19   Maxwell Caul, PA-C  hydrOXYzine (ATARAX/VISTARIL) 25 MG tablet Take 1 tablet (25 mg total) by mouth every 6 (six) hours. 08/09/17   McDonald, Mia A, PA-C  ondansetron (ZOFRAN ODT) 4 MG disintegrating tablet Take 1 tablet (4 mg total) by mouth every 8 (eight) hours as needed for nausea or vomiting. 05/30/17   Kellie Shropshire, PA-C  raltegravir (ISENTRESS) 400 MG tablet Take 1 tablet (400 mg total) by mouth 2 (two) times daily. 12/23/19   Maxwell Caul, PA-C  triamcinolone cream (KENALOG) 0.1 % Apply 1 application topically 2 (two) times daily. 08/09/17   McDonald, Mia A, PA-C    Allergies    Patient has no known allergies.  Review of Systems   Review of Systems  Constitutional: Negative for fever.  Gastrointestinal: Negative for abdominal pain.  Genitourinary: Negative for dysuria, flank pain, frequency, genital sores, hematuria, penile discharge, penile pain, penile swelling, scrotal swelling and testicular pain.    Physical Exam Updated Vital Signs BP (!) 140/93 (BP Location: Left Arm)   Pulse 75   Temp 98.1 F (36.7 C) (Oral)   Resp 16   SpO2 97%   Physical Exam Vitals and nursing note reviewed.  Constitutional:      General: He is not in acute distress.    Appearance: He is well-developed and well-nourished.     Comments: Resting in the bed in no acute distress  HENT:  Head: Normocephalic and atraumatic.  Eyes:     Extraocular Movements: EOM normal.  Cardiovascular:     Rate and Rhythm: Normal rate and regular rhythm.     Pulses: Normal pulses.  Pulmonary:     Effort: Pulmonary effort is normal.     Breath sounds: Normal breath sounds.  Abdominal:     General: There is no distension.     Palpations: Abdomen is soft. There is no mass.     Tenderness: There is  no abdominal tenderness. There is no guarding or rebound.     Comments: No TTP  Musculoskeletal:        General: Normal range of motion.     Cervical back: Normal range of motion.  Skin:    General: Skin is warm.     Capillary Refill: Capillary refill takes less than 2 seconds.     Findings: No rash.  Neurological:     Mental Status: He is alert and oriented to person, place, and time.  Psychiatric:        Mood and Affect: Mood and affect normal.     ED Results / Procedures / Treatments   Labs (all labs ordered are listed, but only abnormal results are displayed) Labs Reviewed  CBC - Abnormal; Notable for the following components:      Result Value   WBC 3.0 (*)    RDW 11.3 (*)    Platelets 149 (*)    All other components within normal limits  COMPREHENSIVE METABOLIC PANEL - Abnormal; Notable for the following components:   Glucose, Bld 109 (*)    All other components within normal limits  RPR  HIV ANTIBODY (ROUTINE TESTING W REFLEX)  GC/CHLAMYDIA PROBE AMP ([place]) NOT AT [facility]    EKG None  Radiology No results found.  Procedures Procedures (including critical care time)  Medications Ordered in ED Medications  elvitegravir-cobicistat-emtricitabine-tenofovir (GENVOYA) 150-150-200-10 Prepack 1 each (1 each Oral Provided for home use 03/11/20 0030)    ED Course  I have reviewed the triage vital signs and the nursing notes.  Pertinent labs & imaging results that were available during my care of the patient were reviewed by me and considered in my medical decision making (see chart for details).    MDM Rules/Calculators/A&P                          Patient presenting due to concerns for possible HIV exposure.  On exam, patient peers nontoxic.  He is having no symptoms.  Per chart review, he has been seen multiple times for this same.  I discussed safe sex practices including using condoms.  I discussed importance of follow-up with ID, as patient may benefit  from being on long-term PrEP therapy.  Patient has not had basic blood work including CMP drawn in several years in our system, as such, will will check LFTs as patient has been on prep multiple times.  Patient is aware that HIV, syphilis, gonorrhea, and Chlamydia tests are pending.    Labs interpreted by me, overall reassuring.  Mild leukopenia at 3.0.  LFTs normal.  At this time, patient appears safe for discharge.  Return precautions given.  Patient states he understands and agrees to plan.  Final Clinical Impression(s) / ED Diagnoses Final diagnoses:  On pre-exposure prophylaxis for HIV    Rx / DC Orders ED Discharge Orders         Ordered    elvitegravir-cobicistat-emtricitabine-tenofovir (  GENVOYA) 150-150-200-10 MG TABS tablet  Daily with breakfast        03/11/20 0017           Alveria Apley, PA-C 03/11/20 0100    Gwyneth Sprout, MD 03/11/20 1505

## 2020-05-13 ENCOUNTER — Emergency Department (HOSPITAL_COMMUNITY)
Admission: EM | Admit: 2020-05-13 | Discharge: 2020-05-13 | Disposition: A | Discharge status: Home / Self Care | Payer: Self-pay | Attending: Emergency Medicine | Admitting: Emergency Medicine

## 2020-05-13 ENCOUNTER — Other Ambulatory Visit: Payer: Self-pay

## 2020-05-13 ENCOUNTER — Encounter (HOSPITAL_COMMUNITY): Payer: Self-pay

## 2020-05-13 DIAGNOSIS — F17210 Nicotine dependence, cigarettes, uncomplicated: Secondary | ICD-10-CM | POA: Insufficient documentation

## 2020-05-13 DIAGNOSIS — L03317 Cellulitis of buttock: Secondary | ICD-10-CM | POA: Insufficient documentation

## 2020-05-13 DIAGNOSIS — W57XXXA Bitten or stung by nonvenomous insect and other nonvenomous arthropods, initial encounter: Secondary | ICD-10-CM | POA: Insufficient documentation

## 2020-05-13 DIAGNOSIS — Z79899 Other long term (current) drug therapy: Secondary | ICD-10-CM | POA: Insufficient documentation

## 2020-05-13 MED ORDER — AMOXICILLIN-POT CLAVULANATE 875-125 MG PO TABS
1.0000 | ORAL_TABLET | Freq: Two times a day (BID) | ORAL | 0 refills | Status: DC
Start: 2020-05-13 — End: 2020-05-17

## 2020-05-13 NOTE — ED Triage Notes (Signed)
Pt arrived via walk in, red area to top of left buttocks. States he believed he got bit by spider. Painful and red x2 days.

## 2020-05-13 NOTE — ED Provider Notes (Signed)
[facility]-EMERGENCY DEPT Provider Note   CSN: [id number] Arrival date & time: 05/13/20  4098     History No chief complaint on file.   Mark Boone is a 30 y.o. male.  HPI Patient is a 30 year old male with no pertinent past medical history presented today with red area on the severe aspect of the left buttocks. He states that he first noticed this 2 days ago.  He states it has a small central head and was concerned that it was a insect bite.  He denies any sudden pain or bleeding or any recent insect in the house.  Denies any other associated symptoms.  No fevers, chills, fatigue or malaise.  He states it is gotten somewhat worse over the past 2 days.  No other associate symptoms.    History reviewed. No pertinent past medical history.  There are no problems to display for this patient.   History reviewed. No pertinent surgical history.     History reviewed. No pertinent family history.  Social History   Tobacco Use  . Smoking status: Current Every Day Smoker    Types: Cigarettes  . Smokeless tobacco: Never Used  Substance Use Topics  . Alcohol use: Yes  . Drug use: No    Home Medications Prior to Admission medications   Medication Sig Start Date End Date Taking? Authorizing Provider  amoxicillin-clavulanate (AUGMENTIN) 875-125 MG tablet Take 1 tablet by mouth every 12 (twelve) hours. 05/13/20  Yes Fondaw, Wylder S, PA  dolutegravir (TIVICAY) 50 MG tablet Take 1 tablet (50 mg total) by mouth daily. 05/30/17   Kellie Shropshire, PA-C  elvitegravir-cobicistat-emtricitabine-tenofovir (GENVOYA) 150-150-200-10 MG TABS tablet Take 1 tablet by mouth daily with breakfast. 08/09/17   McDonald, Mia A, PA-C  elvitegravir-cobicistat-emtricitabine-tenofovir (GENVOYA) 150-150-200-10 MG TABS tablet Take 1 tablet by mouth daily with breakfast. 08/09/17   McDonald, Mia A, PA-C  elvitegravir-cobicistat-emtricitabine-tenofovir (GENVOYA) 150-150-200-10 MG  TABS tablet Take 1 tablet by mouth daily with breakfast. 03/11/20   Caccavale, Sophia, PA-C  emtricitabine-tenofovir (TRUVADA) 200-300 MG tablet Take 1 tablet by mouth daily. 12/23/19   Maxwell Caul, PA-C  hydrOXYzine (ATARAX/VISTARIL) 25 MG tablet Take 1 tablet (25 mg total) by mouth every 6 (six) hours. 08/09/17   McDonald, Mia A, PA-C  ondansetron (ZOFRAN ODT) 4 MG disintegrating tablet Take 1 tablet (4 mg total) by mouth every 8 (eight) hours as needed for nausea or vomiting. 05/30/17   Kellie Shropshire, PA-C  raltegravir (ISENTRESS) 400 MG tablet Take 1 tablet (400 mg total) by mouth 2 (two) times daily. 12/23/19   Maxwell Caul, PA-C  triamcinolone cream (KENALOG) 0.1 % Apply 1 application topically 2 (two) times daily. 08/09/17   McDonald, Mia A, PA-C    Allergies    Patient has no known allergies.  Review of Systems   Review of Systems  Constitutional: Negative for chills and fever.  HENT: Negative for congestion.   Respiratory: Negative for shortness of breath.   Cardiovascular: Negative for chest pain.  Gastrointestinal: Negative for abdominal pain.  Musculoskeletal: Negative for neck pain.  Skin:       Painful red area left gluteus    Physical Exam Updated Vital Signs BP (!) 148/94 (BP Location: Left Arm)   Pulse 69   Temp 99.1 F (37.3 C) (Oral)   Resp 16   Ht 6' (1.829 m)   Wt 93 kg   SpO2 100%   BMI 27.80 kg/m   Physical Exam Vitals and nursing note  reviewed.  Constitutional:      General: He is not in acute distress.    Appearance: Normal appearance. He is not ill-appearing.  HENT:     Head: Normocephalic and atraumatic.     Mouth/Throat:     Mouth: Mucous membranes are moist.  Eyes:     General: No scleral icterus.       Right eye: No discharge.        Left eye: No discharge.     Conjunctiva/sclera: Conjunctivae normal.  Pulmonary:     Effort: Pulmonary effort is normal.     Breath sounds: No stridor.  Skin:    General: Skin is warm and dry.      Capillary Refill: Capillary refill takes less than 2 seconds.     Comments: Approximately 3 cm in diameter erythematous circular region with small central pinpoint opening no fluctuance.  Mild tenderness to palpation.  Skin is mildly warm to touch.  Neurological:     Mental Status: He is alert and oriented to person, place, and time. Mental status is at baseline.     ED Results / Procedures / Treatments   Labs (all labs ordered are listed, but only abnormal results are displayed) Labs Reviewed - No data to display  EKG None  Radiology No results found.  Procedures Ultrasound ED Soft Tissue  Date/Time: 05/13/2020 10:09 AM Performed by: Gailen Shelter, PA Authorized by: Gailen Shelter, PA   Procedure details:    Indications: evaluate for cellulitis     Transverse view:  Visualized   Longitudinal view:  Visualized   Images: not archived     Limitations:  Positioning Location:    Location: buttocks     Side:  Left Findings:     no abscess present    cellulitis present Comments:     No abscess noted on bedside soft tissue ultrasound examination.     Medications Ordered in ED Medications - No data to display  ED Course  I have reviewed the triage vital signs and the nursing notes.  Pertinent labs & imaging results that were available during my care of the patient were reviewed by me and considered in my medical decision making (see chart for details).    MDM Rules/Calculators/A&P                          Cellulitis of the left gluteus.  Does appear to be an infected insect bite.  Will treat with Augmentin.  She will follow-up at the [facility] health wellness clinic or return to ER for recheck.  Given antibiotics.  Understands wound care and follow-up instructions.  Final Clinical Impression(s) / ED Diagnoses Final diagnoses:  Cellulitis of buttock    Rx / DC Orders ED Discharge Orders         Ordered    amoxicillin-clavulanate (AUGMENTIN) 875-125 MG tablet   Every 12 hours        05/13/20 0949           Gailen Shelter, PA 05/13/20 1011    Alvira Monday, MD 05/14/20 [phone]

## 2020-05-13 NOTE — Discharge Instructions (Addendum)
Please do warm compresses at least 3 times daily to the area and areas of warm washcloth you may take warm baths to help stretch this area.  Take the antibiotic as prescribed.  Follow-up with the [facility] health and wellness clinic for reevaluation. You may always return to the ER for any new or concerning symptoms.  The antibiotic I prescribed you is Augmentin is amoxicillin plus clavulanic acid.  Please take for the entire course even if you feel significantly better.

## 2020-05-17 ENCOUNTER — Encounter (HOSPITAL_COMMUNITY): Payer: Self-pay | Admitting: Emergency Medicine

## 2020-05-17 ENCOUNTER — Other Ambulatory Visit: Payer: Self-pay

## 2020-05-17 ENCOUNTER — Emergency Department (HOSPITAL_COMMUNITY)
Admission: EM | Admit: 2020-05-17 | Discharge: 2020-05-17 | Disposition: A | Discharge status: Home / Self Care | Payer: Self-pay | Attending: Emergency Medicine | Admitting: Emergency Medicine

## 2020-05-17 DIAGNOSIS — F17210 Nicotine dependence, cigarettes, uncomplicated: Secondary | ICD-10-CM | POA: Insufficient documentation

## 2020-05-17 DIAGNOSIS — L0291 Cutaneous abscess, unspecified: Secondary | ICD-10-CM

## 2020-05-17 DIAGNOSIS — R220 Localized swelling, mass and lump, head: Secondary | ICD-10-CM | POA: Insufficient documentation

## 2020-05-17 DIAGNOSIS — L0231 Cutaneous abscess of buttock: Secondary | ICD-10-CM | POA: Insufficient documentation

## 2020-05-17 LAB — CBC WITH DIFFERENTIAL/PLATELET
Abs Immature Granulocytes: 0.01 10*3/uL (ref 0.00–0.07)
Basophils Absolute: 0 10*3/uL (ref 0.0–0.1)
Basophils Relative: 1 %
Eosinophils Absolute: 0.2 10*3/uL (ref 0.0–0.5)
Eosinophils Relative: 4 %
HCT: 41.1 % (ref 39.0–52.0)
Hemoglobin: 13.3 g/dL (ref 13.0–17.0)
Immature Granulocytes: 0 %
Lymphocytes Relative: 38 %
Lymphs Abs: 2.1 10*3/uL (ref 0.7–4.0)
MCH: 32 pg (ref 26.0–34.0)
MCHC: 32.4 g/dL (ref 30.0–36.0)
MCV: 98.8 fL (ref 80.0–100.0)
Monocytes Absolute: 0.5 10*3/uL (ref 0.1–1.0)
Monocytes Relative: 8 %
Neutro Abs: 2.8 10*3/uL (ref 1.7–7.7)
Neutrophils Relative %: 49 %
Platelets: 194 10*3/uL (ref 150–400)
RBC: 4.16 MIL/uL — ABNORMAL LOW (ref 4.22–5.81)
RDW: 11.5 % (ref 11.5–15.5)
WBC: 5.6 10*3/uL (ref 4.0–10.5)
nRBC: 0 % (ref 0.0–0.2)

## 2020-05-17 LAB — BASIC METABOLIC PANEL
Anion gap: 6 (ref 5–15)
BUN: 17 mg/dL (ref 6–20)
CO2: 29 mmol/L (ref 22–32)
Calcium: 9.6 mg/dL (ref 8.9–10.3)
Chloride: 105 mmol/L (ref 98–111)
Creatinine, Ser: 1.11 mg/dL (ref 0.61–1.24)
GFR, Estimated: >60 mL/min (ref >60)
Glucose, Bld: 91 mg/dL (ref 70–99)
Potassium: 4 mmol/L (ref 3.5–5.1)
Sodium: 140 mmol/L (ref 135–145)

## 2020-05-17 MED ORDER — CEPHALEXIN 500 MG PO CAPS: 500.0000 mg | ORAL_CAPSULE | Freq: Four times a day (QID) | ORAL | 0 refills | Status: AC

## 2020-05-17 MED ORDER — DOXYCYCLINE HYCLATE 100 MG PO CAPS: 100.0000 mg | ORAL_CAPSULE | Freq: Two times a day (BID) | ORAL | 0 refills | Status: DC

## 2020-05-17 MED ORDER — LIDOCAINE-EPINEPHRINE (PF) 2 %-1:200000 IJ SOLN
10.0000 mL | Freq: Once | INTRAMUSCULAR | Status: AC
  Administered 2020-05-17: 10 mL
  Filled 2020-05-17: qty 20

## 2020-05-17 MED ORDER — HYDROCODONE-ACETAMINOPHEN 5-325 MG PO TABS: 1.0000 | ORAL_TABLET | Freq: Four times a day (QID) | ORAL | 0 refills | Status: DC | PRN

## 2020-05-17 NOTE — ED Provider Notes (Signed)
[facility]-EMERGENCY DEPT Provider Note   CSN: [id number] Arrival date & time: 05/17/20  1805     History Chief Complaint  Patient presents with  . Cellulitis    Kelsie Kramp is a 30 y.o. male with no pertinent past medical history who presents today for evaluation of abscess. He was seen here on 05/13/2020 where he was diagnosed with cellulitis and treated with Augmentin.  He he reports that he has been compliant with this however over the past few days has noted a increase in swelling, lip swelling tracking around the lateral buttock and drainage.  He denies any fevers, reports that otherwise he feels well. He denies any history of similar.  HPI     History reviewed. No pertinent past medical history.  There are no problems to display for this patient.   History reviewed. No pertinent surgical history.     No family history on file.  Social History   Tobacco Use  . Smoking status: Current Every Day Smoker    Types: Cigarettes  . Smokeless tobacco: Never Used  Substance Use Topics  . Alcohol use: Yes  . Drug use: No    Home Medications Prior to Admission medications   Medication Sig Start Date End Date Taking? Authorizing Provider  cephALEXin (KEFLEX) 500 MG capsule Take 1 capsule (500 mg total) by mouth 4 (four) times daily for 10 days. 05/17/20 05/27/20 Yes Cristina Gong, PA-C  doxycycline (VIBRAMYCIN) 100 MG capsule Take 1 capsule (100 mg total) by mouth 2 (two) times daily. 05/17/20  Yes Cristina Gong, PA-C  HYDROcodone-acetaminophen (NORCO/VICODIN) 5-325 MG tablet Take 1 tablet by mouth every 6 (six) hours as needed for severe pain. 05/17/20  Yes Cristina Gong, PA-C  dolutegravir (TIVICAY) 50 MG tablet Take 1 tablet (50 mg total) by mouth daily. 05/30/17   Kellie Shropshire, PA-C  elvitegravir-cobicistat-emtricitabine-tenofovir (GENVOYA) 150-150-200-10 MG TABS tablet Take 1 tablet by mouth daily with breakfast. 08/09/17    McDonald, Mia A, PA-C  elvitegravir-cobicistat-emtricitabine-tenofovir (GENVOYA) 150-150-200-10 MG TABS tablet Take 1 tablet by mouth daily with breakfast. 08/09/17   McDonald, Mia A, PA-C  elvitegravir-cobicistat-emtricitabine-tenofovir (GENVOYA) 150-150-200-10 MG TABS tablet Take 1 tablet by mouth daily with breakfast. 03/11/20   Caccavale, Sophia, PA-C  emtricitabine-tenofovir (TRUVADA) 200-300 MG tablet Take 1 tablet by mouth daily. 12/23/19   Maxwell Caul, PA-C  hydrOXYzine (ATARAX/VISTARIL) 25 MG tablet Take 1 tablet (25 mg total) by mouth every 6 (six) hours. 08/09/17   McDonald, Mia A, PA-C  ondansetron (ZOFRAN ODT) 4 MG disintegrating tablet Take 1 tablet (4 mg total) by mouth every 8 (eight) hours as needed for nausea or vomiting. 05/30/17   Kellie Shropshire, PA-C  raltegravir (ISENTRESS) 400 MG tablet Take 1 tablet (400 mg total) by mouth 2 (two) times daily. 12/23/19   Maxwell Caul, PA-C  triamcinolone cream (KENALOG) 0.1 % Apply 1 application topically 2 (two) times daily. 08/09/17   McDonald, Mia A, PA-C    Allergies    Patient has no known allergies.  Review of Systems   Review of Systems  Constitutional: Negative for chills and fever.  Respiratory: Negative for shortness of breath.   Cardiovascular: Negative for chest pain.  Gastrointestinal: Negative for abdominal pain.  Genitourinary: Negative for dysuria.  Skin: Positive for color change and wound.  Allergic/Immunologic: Negative for immunocompromised state.  Neurological: Negative for weakness and headaches.  All other systems reviewed and are negative.   Physical Exam Updated Vital Signs  BP 137/80   Pulse 75   Temp 97.9 F (36.6 C) (Oral)   Resp 16   SpO2 98%   Physical Exam Vitals and nursing note reviewed.  Constitutional:      General: He is not in acute distress.    Appearance: He is not diaphoretic.  HENT:     Head: Normocephalic and atraumatic.  Eyes:     General: No scleral icterus.        Right eye: No discharge.        Left eye: No discharge.     Conjunctiva/sclera: Conjunctivae normal.  Cardiovascular:     Rate and Rhythm: Normal rate and regular rhythm.  Pulmonary:     Effort: Pulmonary effort is normal. No respiratory distress.     Breath sounds: No stridor.  Abdominal:     General: There is no distension.  Musculoskeletal:        General: No deformity.     Cervical back: Normal range of motion.  Skin:    General: Skin is warm and dry.     Comments: On the left buttock there is a approximately 5 cm area of induration.  Centralized tenderness there is a shallow ulcer with underlying fluctuance and scant purulent drainage.  There is mild surrounding erythema extending towards the lateral left buttock.  The erythema does not extend into the inguinal area, and according to patient, who declined GU exam, does not extend into the genitals.  Neurological:     Mental Status: He is alert.     Motor: No abnormal muscle tone.  Psychiatric:        Behavior: Behavior normal.     ED Results / Procedures / Treatments   Labs (all labs ordered are listed, but only abnormal results are displayed) Labs Reviewed  CBC WITH DIFFERENTIAL/PLATELET - Abnormal; Notable for the following components:      Result Value   RBC 4.16 (*)    All other components within normal limits  BASIC METABOLIC PANEL    EKG None  Radiology No results found.  Procedures .Marland KitchenIncision and Drainage  Date/Time: 05/17/2020 11:50 PM Performed by: Cristina Gong, PA-C Authorized by: Cristina Gong, PA-C   Consent:    Consent obtained:  Verbal   Consent given by:  Patient   Risks, benefits, and alternatives were discussed: yes     Risks discussed:  Bleeding, incomplete drainage, pain, infection and damage to other organs (Damage to other structures, need for additional procedures)   Alternatives discussed:  No treatment, alternative treatment and referral Location:    Type:  Abscess    Size:  2cmx2cm   Location: Left buttock. Pre-procedure details:    Skin preparation:  Chlorhexidine Anesthesia:    Anesthesia method:  Local infiltration   Local anesthetic:  Lidocaine 2% WITH epi Procedure type:    Complexity:  Complex Procedure details:    Incision types:  Stab incision   Incision depth:  Subcutaneous   Scalpel blade:  11   Wound management:  Probed and deloculated and irrigated with saline   Drainage:  Purulent and bloody   Drainage amount:  Moderate   Packing material: Iodoform gauze. Post-procedure details:    Procedure completion:  Tolerated well, no immediate complications Ultrasound ED Soft Tissue  Date/Time: 05/17/2020 11:52 PM Performed by: Cristina Gong, PA-C Authorized by: Cristina Gong, PA-C   Procedure details:    Indications: localization of abscess and evaluate for cellulitis     Transverse view:  Visualized   Longitudinal view:  Visualized   Images: archived   Location:    Location: buttocks     Side:  Left Findings:     abscess present Comments:     Abscesses identified with pocket that appears consistent with drainage.  On the areas of redness that began to wrap around towards the lateral buttock there is no evidence of cellulitis on ultrasound exam, however there is mild cellulitis immediately surrounding the abscess.     Medications Ordered in ED Medications  lidocaine-EPINEPHrine (XYLOCAINE W/EPI) 2 %-1:200000 (PF) injection 10 mL (10 mLs Infiltration Given 05/17/20 2118)    ED Course  I have reviewed the triage vital signs and the nursing notes.  Pertinent labs & imaging results that were available during my care of the patient were reviewed by me and considered in my medical decision making (see chart for details).    MDM Rules/Calculators/A&P                         Patient is a 30 year old man who presents today for evaluation of worsening pain and swelling now with drainage.  He has been treated with Augmentin  already for this however it now appears to have developed into an abscess.  Ultrasound is used to confirm presence of abscess and to further evaluate the red areas.  He does have some edema and redness that is now wrapping towards the lateral buttock, however ultrasound did not clearly show evidence of cellulitis or purulence in this area. Given the possibility of spread labs are obtained and reviewed, CBC shows no leukocytosis or anemia and BMP is unremarkable.  Patient is afebrile, not tachycardic or tachypneic and generally well-appearing without constitutional symptoms. I discussed at length with patient, and per his request his mother, admission versus trial of p.o. antibiotics after drainage. Given that the abscess has now been drained we discussed that I feel it is very appropriate to try treatment with better MRSA coverage and monitor for resolution.  We discussed risks, benefits, and alternatives. We will attempt trial of therapy with different antibiotics now that this has been drained.  Patient is instructed to return to the emergency room in 2 days for wound recheck or sooner if fevers or other concerning symptoms develop. [facility] [place] PMP is consulted and he is given a prescription for short course of narcotic pain medicine after we discussed risks, benefits, and alternatives.  Return precautions were discussed with patient who states their understanding.  At the time of discharge patient denied any unaddressed complaints or concerns.  Patient is agreeable for discharge home.  Note: Portions of this report may have been transcribed using voice recognition software. Every effort was made to ensure accuracy; however, inadvertent computerized transcription errors may be present  Final Clinical Impression(s) / ED Diagnoses Final diagnoses:  Abscess    Rx / DC Orders ED Discharge Orders         Ordered    HYDROcodone-acetaminophen (NORCO/VICODIN) 5-325 MG tablet  Every 6 hours PRN         05/17/20 2113    cephALEXin (KEFLEX) 500 MG capsule  4 times daily        05/17/20 2113    doxycycline (VIBRAMYCIN) 100 MG capsule  2 times daily        05/17/20 2113           Cristina Gong, PA-C 05/17/20 2356    Alvira Monday, MD 05/19/20 1702

## 2020-05-17 NOTE — ED Triage Notes (Signed)
Per pt, states he was seen on 2/26 for rash on left buttocks-states it was spreading-states he was diagnosed with cellulitis-is currently on antibiotic

## 2020-05-17 NOTE — Discharge Instructions (Addendum)
Please take Ibuprofen (Advil, motrin) and Tylenol (acetaminophen) to relieve your pain.    As we discussed today your blood work and vital signs are reassuring. I suspect that by draining the collection of pus and starting you on different antibiotics this should get better. Please stop taking the Augmentin. If you develop fevers, noticed any redness in your penis, testicles or any abnormal swelling in these areas, or have other concerns including feeling like the red area is significantly worsening please seek additional medical care and evaluation. Please come back in 2 days for wound recheck and packing removal. Alternatively if you have a primary care doctor you may follow-up with them in 2 days.  You may shower. Please do not soak or submerge your wound. If the packing comes out early that is okay.  You may take up to 600 MG (3 pills) of normal strength ibuprofen every 8 hours as needed.   You make take tylenol, up to 1,000 mg (two extra strength pills) every 8 hours as needed.   It is safe to take ibuprofen and tylenol at the same time as they work differently.   Do not take more than 3,000 mg tylenol in a 24 hour period (not more than one dose every 8 hours.  Please check all medication labels as many medications such as pain and cold medications may contain tylenol.  Do not drink alcohol while taking these medications.  Do not take other NSAID'S while taking ibuprofen (such as aleve or naproxen).  Please take ibuprofen with food to decrease stomach upset.  You may have diarrhea from the antibiotics.  It is very important that you continue to take the antibiotics even if you get diarrhea unless a medical professional tells you that you may stop taking them.  If you stop too early the bacteria you are being treated for will become stronger and you may need different, more powerful antibiotics that have more side effects and worsening diarrhea.  Please stay well hydrated and consider probiotics  as they may decrease the severity of your diarrhea.   You are being prescribed a medication which may make you sleepy. For 24 hours after one dose please do not drive, operate heavy machinery, care for a small child with out another adult present, or perform any activities that may cause harm to you or someone else if you were to fall asleep or be impaired.

## 2020-05-17 NOTE — ED Notes (Signed)
Elizabeth, PA at bedside.  

## 2020-07-05 ENCOUNTER — Emergency Department (HOSPITAL_BASED_OUTPATIENT_CLINIC_OR_DEPARTMENT_OTHER)
Admission: EM | Admit: 2020-07-05 | Discharge: 2020-07-05 | Disposition: A | Discharge status: Home / Self Care | Payer: Self-pay | Attending: Emergency Medicine | Admitting: Emergency Medicine

## 2020-07-05 ENCOUNTER — Other Ambulatory Visit: Payer: Self-pay

## 2020-07-05 ENCOUNTER — Encounter (HOSPITAL_BASED_OUTPATIENT_CLINIC_OR_DEPARTMENT_OTHER): Payer: Self-pay

## 2020-07-05 ENCOUNTER — Telehealth (HOSPITAL_BASED_OUTPATIENT_CLINIC_OR_DEPARTMENT_OTHER): Payer: Self-pay | Admitting: Emergency Medicine

## 2020-07-05 DIAGNOSIS — F17210 Nicotine dependence, cigarettes, uncomplicated: Secondary | ICD-10-CM | POA: Insufficient documentation

## 2020-07-05 DIAGNOSIS — Z202 Contact with and (suspected) exposure to infections with a predominantly sexual mode of transmission: Secondary | ICD-10-CM | POA: Insufficient documentation

## 2020-07-05 LAB — RAPID HIV SCREEN (HIV 1/2 AB+AG)
HIV 1/2 Antibodies: NONREACTIVE
HIV-1 P24 Antigen - HIV24: NONREACTIVE

## 2020-07-05 MED ORDER — CYCLOBENZAPRINE HCL 10 MG PO TABS: 10.0000 mg | ORAL_TABLET | Freq: Two times a day (BID) | ORAL | 0 refills | Status: DC | PRN

## 2020-07-05 MED ORDER — RALTEGRAVIR POTASSIUM 400 MG PO TABS: 400.0000 mg | ORAL_TABLET | Freq: Two times a day (BID) | ORAL | 0 refills | Status: DC

## 2020-07-05 MED ORDER — EMTRICITABINE-TENOFOVIR DF 200-300 MG PO TABS: 1.0000 | ORAL_TABLET | Freq: Every day | ORAL | 0 refills | Status: DC

## 2020-07-05 NOTE — ED Triage Notes (Signed)
Patient here POV from Home.  Patient had unprotected sex with new partner and patient is worried about having contracted a STD.  Patient has no complaints such as burning or itching.  NAD, Ambulatory, No Pain.

## 2020-07-05 NOTE — ED Notes (Signed)
Pt does not have any symptoms or complaints at this time he is just worried that his sexual partner is being unfaithful. He states he doesn't believe his partner to have any symptoms at this time or being treated for an STD.

## 2020-07-05 NOTE — Discharge Instructions (Addendum)
You have been seen and discharged from the emergency department.  Take medication as directed.  Follow-up with your primary provider for reevaluation and further care. Take home medications as prescribed. If you have any worsening symptoms or further concerns for your health please return to an emergency department for further evaluation.

## 2020-07-05 NOTE — ED Provider Notes (Signed)
[facility] EMERGENCY DEPT Provider Note   CSN: [id number] Arrival date & time: 07/05/20  1853     History Chief Complaint  Patient presents with  . Exposure to STD    Possible    Trust Leh is a 30 y.o. male.  HPI   30 year old male presents the emergency department with concern for STD exposure.  Patient states that he had unprotected sex last night with a new partner.  It is reported that this new partner has had STDs in the past.  He denies any acute symptoms including penile swelling, discharge, burning or itching.  No recent fever.  History reviewed. No pertinent past medical history.  There are no problems to display for this patient.   History reviewed. No pertinent surgical history.     No family history on file.  Social History   Tobacco Use  . Smoking status: Current Every Day Smoker    Types: Cigarettes  . Smokeless tobacco: Never Used  . Tobacco comment: 3 cigarettes/day  Substance Use Topics  . Alcohol use: Yes    Comment: Socially   . Drug use: No    Home Medications Prior to Admission medications   Medication Sig Start Date End Date Taking? Authorizing Provider  emtricitabine-tenofovir (TRUVADA) 200-300 MG tablet Take 1 tablet by mouth daily. 07/05/20  Yes Jerrilyn Messinger M, DO  dolutegravir (TIVICAY) 50 MG tablet Take 1 tablet (50 mg total) by mouth daily. 05/30/17   Kellie Shropshire, PA-C  doxycycline (VIBRAMYCIN) 100 MG capsule Take 1 capsule (100 mg total) by mouth 2 (two) times daily. 05/17/20   Cristina Gong, PA-C  elvitegravir-cobicistat-emtricitabine-tenofovir (GENVOYA) 150-150-200-10 MG TABS tablet Take 1 tablet by mouth daily with breakfast. 08/09/17   McDonald, Mia A, PA-C  elvitegravir-cobicistat-emtricitabine-tenofovir (GENVOYA) 150-150-200-10 MG TABS tablet Take 1 tablet by mouth daily with breakfast. 08/09/17   McDonald, Mia A, PA-C  elvitegravir-cobicistat-emtricitabine-tenofovir (GENVOYA) 150-150-200-10 MG  TABS tablet Take 1 tablet by mouth daily with breakfast. 03/11/20   Caccavale, Sophia, PA-C  HYDROcodone-acetaminophen (NORCO/VICODIN) 5-325 MG tablet Take 1 tablet by mouth every 6 (six) hours as needed for severe pain. 05/17/20   Cristina Gong, PA-C  hydrOXYzine (ATARAX/VISTARIL) 25 MG tablet Take 1 tablet (25 mg total) by mouth every 6 (six) hours. 08/09/17   McDonald, Mia A, PA-C  ondansetron (ZOFRAN ODT) 4 MG disintegrating tablet Take 1 tablet (4 mg total) by mouth every 8 (eight) hours as needed for nausea or vomiting. 05/30/17   Kellie Shropshire, PA-C  raltegravir (ISENTRESS) 400 MG tablet Take 1 tablet (400 mg total) by mouth 2 (two) times daily. 12/23/19   Maxwell Caul, PA-C  triamcinolone cream (KENALOG) 0.1 % Apply 1 application topically 2 (two) times daily. 08/09/17   McDonald, Mia A, PA-C    Allergies    Patient has no known allergies.  Review of Systems   Review of Systems  Constitutional: Negative for chills and fever.  Respiratory: Negative for shortness of breath.   Cardiovascular: Negative for chest pain.  Gastrointestinal: Negative for abdominal pain.  Genitourinary: Negative for dysuria, penile discharge, penile pain, penile swelling, scrotal swelling and testicular pain.  Skin: Negative for rash.    Physical Exam Updated Vital Signs BP 137/79 (BP Location: Left Arm)   Pulse 76   Temp 98.7 F (37.1 C) (Oral)   Resp 16   Ht 6' (1.829 m)   Wt 95.3 kg   SpO2 100%   BMI 28.48 kg/m   Physical Exam  Vitals and nursing note reviewed.  Constitutional:      Appearance: Normal appearance.  HENT:     Head: Normocephalic.     Mouth/Throat:     Mouth: Mucous membranes are moist.  Cardiovascular:     Rate and Rhythm: Normal rate.  Pulmonary:     Effort: Pulmonary effort is normal. No respiratory distress.  Genitourinary:    Comments: Declined Skin:    General: Skin is warm.  Neurological:     Mental Status: He is alert and oriented to person, place,  and time. Mental status is at baseline.  Psychiatric:        Mood and Affect: Mood normal.     ED Results / Procedures / Treatments   Labs (all labs ordered are listed, but only abnormal results are displayed) Labs Reviewed  RAPID HIV SCREEN (HIV 1/2 AB+AG)  GC/CHLAMYDIA PROBE AMP ([place]) NOT AT [facility]    EKG None  Radiology No results found.  Procedures Procedures   Medications Ordered in ED Medications - No data to display  ED Course  I have reviewed the triage vital signs and the nursing notes.  Pertinent labs & imaging results that were available during my care of the patient were reviewed by me and considered in my medical decision making (see chart for details).    MDM Rules/Calculators/A&P                          30 year old male presents emergency department with exposure to STD, concern for unprotected sex with a partner reported to have STD and possibly HIV.  He is requesting STD testing including HIV postexposure prophylaxis.  STD testing ordered, prophylaxis ordered.  Patient educated.  Patient will be discharged and treated as an outpatient.  Discharge plan and strict return to ED precautions discussed, patient verbalizes understanding and agreement.  Final Clinical Impression(s) / ED Diagnoses Final diagnoses:  STD exposure    Rx / DC Orders ED Discharge Orders         Ordered    cyclobenzaprine (FLEXERIL) 10 MG tablet  2 times daily PRN,   Status:  Discontinued        07/05/20 2102    emtricitabine-tenofovir (TRUVADA) 200-300 MG tablet  Daily        07/05/20 2108           Rozelle Logan, DO 07/05/20 2125

## 2020-07-05 NOTE — Telephone Encounter (Signed)
Patient notified staff that the second part of his prescription did not get successfully sent.  I have resent this.

## 2020-07-07 LAB — GC/CHLAMYDIA PROBE AMP (~~LOC~~) NOT AT ARMC
Chlamydia: NEGATIVE
Comment: NEGATIVE
Comment: NORMAL
Neisseria Gonorrhea: NEGATIVE

## 2020-08-04 ENCOUNTER — Emergency Department (HOSPITAL_COMMUNITY)
Admission: EM | Admit: 2020-08-04 | Discharge: 2020-08-04 | Disposition: A | Discharge status: Home / Self Care | Payer: Self-pay | Attending: Emergency Medicine | Admitting: Emergency Medicine

## 2020-08-04 ENCOUNTER — Encounter (HOSPITAL_COMMUNITY): Payer: Self-pay | Admitting: Emergency Medicine

## 2020-08-04 ENCOUNTER — Other Ambulatory Visit: Payer: Self-pay

## 2020-08-04 DIAGNOSIS — F17210 Nicotine dependence, cigarettes, uncomplicated: Secondary | ICD-10-CM | POA: Insufficient documentation

## 2020-08-04 DIAGNOSIS — R21 Rash and other nonspecific skin eruption: Secondary | ICD-10-CM | POA: Insufficient documentation

## 2020-08-04 MED ORDER — PERMETHRIN 5 % EX CREA: TOPICAL_CREAM | CUTANEOUS | 1 refills | Status: DC

## 2020-08-04 NOTE — ED Triage Notes (Signed)
Patient here from home reporting insect bite 2 days to right side/abd. States rash is getting worse. Denies known allergies.

## 2020-08-04 NOTE — ED Provider Notes (Signed)
[facility]-EMERGENCY DEPT Provider Note   CSN: [id number] Arrival date & time: 08/04/20  1810     History Chief Complaint  Patient presents with  . Insect Bite    Mark Boone is a 30 y.o. male.  Patient presents to the emergency department with a chief complaint of rash on his right trunk.  He states that he has noticed some bumps over the past 4 weeks.  He reports minimal itch.  Denies any fever.  Denies pain.  States that he is concerned there bites.  States that he has been staying at a friend's house.  Denies any successful treatments prior to arrival.  The history is provided by the patient. No language interpreter was used.       History reviewed. No pertinent past medical history.  There are no problems to display for this patient.   History reviewed. No pertinent surgical history.     No family history on file.  Social History   Tobacco Use  . Smoking status: Current Every Day Smoker    Types: Cigarettes  . Smokeless tobacco: Never Used  . Tobacco comment: 3 cigarettes/day  Substance Use Topics  . Alcohol use: Yes    Comment: Socially   . Drug use: No    Home Medications Prior to Admission medications   Medication Sig Start Date End Date Taking? Authorizing Provider  dolutegravir (TIVICAY) 50 MG tablet Take 1 tablet (50 mg total) by mouth daily. 05/30/17   Kellie Shropshire, PA-C  doxycycline (VIBRAMYCIN) 100 MG capsule Take 1 capsule (100 mg total) by mouth 2 (two) times daily. 05/17/20   Cristina Gong, PA-C  elvitegravir-cobicistat-emtricitabine-tenofovir (GENVOYA) 150-150-200-10 MG TABS tablet Take 1 tablet by mouth daily with breakfast. 08/09/17   McDonald, Mia A, PA-C  elvitegravir-cobicistat-emtricitabine-tenofovir (GENVOYA) 150-150-200-10 MG TABS tablet Take 1 tablet by mouth daily with breakfast. 08/09/17   McDonald, Mia A, PA-C  elvitegravir-cobicistat-emtricitabine-tenofovir (GENVOYA) 150-150-200-10 MG TABS tablet  Take 1 tablet by mouth daily with breakfast. 03/11/20   Caccavale, Sophia, PA-C  emtricitabine-tenofovir (TRUVADA) 200-300 MG tablet Take 1 tablet by mouth daily. 07/05/20   Horton, Clabe Seal, DO  HYDROcodone-acetaminophen (NORCO/VICODIN) 5-325 MG tablet Take 1 tablet by mouth every 6 (six) hours as needed for severe pain. 05/17/20   Cristina Gong, PA-C  hydrOXYzine (ATARAX/VISTARIL) 25 MG tablet Take 1 tablet (25 mg total) by mouth every 6 (six) hours. 08/09/17   McDonald, Mia A, PA-C  ondansetron (ZOFRAN ODT) 4 MG disintegrating tablet Take 1 tablet (4 mg total) by mouth every 8 (eight) hours as needed for nausea or vomiting. 05/30/17   Kellie Shropshire, PA-C  raltegravir (ISENTRESS) 400 MG tablet Take 1 tablet (400 mg total) by mouth 2 (two) times daily. 07/05/20   Horton, Clabe Seal, DO  triamcinolone cream (KENALOG) 0.1 % Apply 1 application topically 2 (two) times daily. 08/09/17   McDonald, Mia A, PA-C    Allergies    Patient has no known allergies.  Review of Systems   Review of Systems  All other systems reviewed and are negative.   Physical Exam Updated Vital Signs BP (!) 136/92 (BP Location: Left Arm)   Pulse 70   Temp 98.4 F (36.9 C) (Oral)   Resp 16   SpO2 98%   Physical Exam Vitals and nursing note reviewed.  Constitutional:      General: He is not in acute distress.    Appearance: He is well-developed. He is not ill-appearing.  HENT:  Head: Normocephalic and atraumatic.  Eyes:     Conjunctiva/sclera: Conjunctivae normal.  Cardiovascular:     Rate and Rhythm: Normal rate.  Pulmonary:     Effort: Pulmonary effort is normal. No respiratory distress.  Abdominal:     General: There is no distension.  Musculoskeletal:     Cervical back: Neck supple.     Comments: Moves all extremities  Skin:    General: Skin is warm and dry.     Comments: Scattered skin colored papules on right trunk No sign of abscess or cellulitis  Neurological:     Mental Status: He  is alert and oriented to person, place, and time.  Psychiatric:        Mood and Affect: Mood normal.        Behavior: Behavior normal.     ED Results / Procedures / Treatments   Labs (all labs ordered are listed, but only abnormal results are displayed) Labs Reviewed - No data to display  EKG None  Radiology No results found.  Procedures Procedures   Medications Ordered in ED Medications - No data to display  ED Course  I have reviewed the triage vital signs and the nursing notes.  Pertinent labs & imaging results that were available during my care of the patient were reviewed by me and considered in my medical decision making (see chart for details).    MDM Rules/Calculators/A&P                          Patient here with rash on torso for 4 weeks.  He is concerned about insect bites from staying in a new location.  Denies fever.  Denies pain. Will trial permethrin.  Recommend PCP/derm follow-up if not improving. Final Clinical Impression(s) / ED Diagnoses Final diagnoses:  Rash    Rx / DC Orders ED Discharge Orders         Ordered    permethrin (ELIMITE) 5 % cream        08/04/20 2313           Roxy Horseman, PA-C 08/04/20 2314    Zadie Rhine, MD 08/05/20 [phone]

## 2020-08-04 NOTE — ED Provider Notes (Signed)
Emergency Medicine Provider Triage Evaluation Note  Mark Boone , a 30 y.o. male  was evaluated in triage.  Pt complains of itchy rash on right side x 4 weeks. No pain associated, only itchy at night. Has only used moisturizer on it so far. No outdoor exposure. Started when sleeping at a friends house. No new soaps.   Review of Systems  Positive: Rash, itching Negative: Fevers, chills, n/v  Physical Exam  BP (!) 136/92 (BP Location: Left Arm)   Pulse 70   Temp 98.4 F (36.9 C) (Oral)   Resp 16   SpO2 98%  Gen:   Awake, no distress   Resp:  Normal effort  MSK:   Moves extremities without difficulty  Other:  Macular rash with some surrounding erythema on the right torso, no open wounds, no crusting or discharge.   Medical Decision Making  Medically screening exam initiated at 8:00 PM.  Appropriate orders placed.  Mark Boone was informed that the remainder of the evaluation will be completed by another provider, this initial triage assessment does not replace that evaluation, and the importance of remaining in the ED until their evaluation is complete.  This chart was dictated using voice recognition software, Dragon. Despite the best efforts of this provider to proofread and correct errors, errors may still occur which can change documentation meaning.    Sherrilee Gilles 08/04/20 2004    Charlynne Pander, MD 08/04/20 2207

## 2020-09-25 ENCOUNTER — Encounter: Payer: Self-pay | Admitting: Family

## 2020-09-25 ENCOUNTER — Other Ambulatory Visit: Payer: Self-pay

## 2020-09-25 ENCOUNTER — Telehealth: Payer: Self-pay

## 2020-09-25 ENCOUNTER — Other Ambulatory Visit (HOSPITAL_COMMUNITY): Payer: Self-pay

## 2020-09-25 ENCOUNTER — Ambulatory Visit (INDEPENDENT_AMBULATORY_CARE_PROVIDER_SITE_OTHER): Payer: Self-pay | Admitting: Family

## 2020-09-25 VITALS — BP 119/82 | HR 63 | Temp 98.0°F | Wt 190.0 lb

## 2020-09-25 DIAGNOSIS — Z7251 High risk heterosexual behavior: Secondary | ICD-10-CM | POA: Insufficient documentation

## 2020-09-25 DIAGNOSIS — Z79899 Other long term (current) drug therapy: Secondary | ICD-10-CM | POA: Insufficient documentation

## 2020-09-25 MED ORDER — EMTRICITABINE-TENOFOVIR AF 200-25 MG PO TABS
1.0000 | ORAL_TABLET | Freq: Every day | ORAL | 11 refills | Status: DC
  Filled 2020-09-25 – 2020-09-27 (×2): qty 30, 30d supply, fill #0
  Filled 2020-10-19: qty 30, 30d supply, fill #1
  Filled 2020-11-16: qty 30, 30d supply, fill #2
  Filled 2020-12-18: qty 30, 30d supply, fill #3
  Filled 2021-01-24: qty 30, 30d supply, fill #4
  Filled 2021-02-16: qty 30, 30d supply, fill #5
  Filled 2021-03-15: qty 30, 30d supply, fill #6
  Filled 2021-04-11: qty 30, 30d supply, fill #7
  Filled 2021-05-07: qty 30, 30d supply, fill #8
  Filled 2021-06-06: qty 30, 30d supply, fill #9
  Filled 2021-07-02: qty 30, 30d supply, fill #10
  Filled 2021-07-26: qty 30, 30d supply, fill #11

## 2020-09-25 NOTE — Telephone Encounter (Signed)
[facility] Patient Advocate Encounter °  °Was successful in obtaining a [employer] copay card for Descovy.  This copay card will make the patients copay 0.00. ° °I have spoken with the patient.   ° °The billing information is as follows and has been shared with [facility]. ° ° ° ° ° ° °Christmas Faraci, CPhT °Specialty Pharmacy Patient Advocate °[facility] °Phone: [phone] °Fax:  [phone]  °

## 2020-09-25 NOTE — Assessment & Plan Note (Addendum)
Mark Boone is a 30 y/o male at high risk for acquiring HIV disease and here today to for pre-exposure prophylaxis. Discussed the risks, benefits, and financial aspects of PrEP. Will start on Descovy and is interested in Appertude. Pharmacy will determine coverage. Check lab work today include HIV, CMP, and STI. Start Descovy. Plan for follow up pending Apertude or in 3 months for Descovy refill. Discussed importance of safe sexual practice and condoms provided.

## 2020-09-25 NOTE — Progress Notes (Signed)
Subjective:    Patient ID: Mark Boone, male    DOB: Aug 14, 1990, 30 y.o.   MRN: [id number]  Chief Complaint  Patient presents with   PrEP    HPI:  Mark Boone is a 30 y.o. male with previous medical history of post-exposure prophylaxis presenting today to discuss pre-exposure prophylaxis for HIV.  Mark Boone has been on post-exposure prophylaxis in the past and is not currently on any medication. Overall feeling well today with no new concerns/complaints. Interested in starting on PrEP. Denies fevers, chills, night sweats, headaches, changes in vision, neck pain/stiffness, nausea, diarrhea, vomiting, lesions or rashes.   No Known Allergies    Outpatient Medications Prior to Visit  Medication Sig Dispense Refill   doxycycline (VIBRAMYCIN) 100 MG capsule Take 1 capsule (100 mg total) by mouth 2 (two) times daily. 20 capsule 0   elvitegravir-cobicistat-emtricitabine-tenofovir (GENVOYA) 150-150-200-10 MG TABS tablet Take 1 tablet by mouth daily with breakfast. 5 tablet 0   elvitegravir-cobicistat-emtricitabine-tenofovir (GENVOYA) 150-150-200-10 MG TABS tablet Take 1 tablet by mouth daily with breakfast. 23 tablet 0   elvitegravir-cobicistat-emtricitabine-tenofovir (GENVOYA) 150-150-200-10 MG TABS tablet Take 1 tablet by mouth daily with breakfast. 30 tablet 0   hydrOXYzine (ATARAX/VISTARIL) 25 MG tablet Take 1 tablet (25 mg total) by mouth every 6 (six) hours. 12 tablet 0   ondansetron (ZOFRAN ODT) 4 MG disintegrating tablet Take 1 tablet (4 mg total) by mouth every 8 (eight) hours as needed for nausea or vomiting. 20 tablet 0   permethrin (ELIMITE) 5 % cream Apply to entire body other than face - let sit for 14 hours then wash off, may repeat in 1 week if still having symptoms 60 g 1   raltegravir (ISENTRESS) 400 MG tablet Take 1 tablet (400 mg total) by mouth 2 (two) times daily. 60 tablet 0   triamcinolone cream (KENALOG) 0.1 % Apply 1 application topically 2 (two)  times daily. 30 g 0   dolutegravir (TIVICAY) 50 MG tablet Take 1 tablet (50 mg total) by mouth daily. (Patient not taking: Reported on 09/25/2020) 28 tablet 0   emtricitabine-tenofovir (TRUVADA) 200-300 MG tablet Take 1 tablet by mouth daily. (Patient not taking: Reported on 09/25/2020) 28 tablet 0   HYDROcodone-acetaminophen (NORCO/VICODIN) 5-325 MG tablet Take 1 tablet by mouth every 6 (six) hours as needed for severe pain. (Patient not taking: Reported on 09/25/2020) 8 tablet 0   No facility-administered medications prior to visit.     History reviewed. No pertinent past medical history.    History reviewed. No pertinent surgical history.    History reviewed. No pertinent family history.    Social History   Socioeconomic History   Marital status: Single    Spouse name: Not on file   Number of children: Not on file   Years of education: Not on file   Highest education level: Not on file  Occupational History   Not on file  Tobacco Use   Smoking status: Every Day    Pack years: 0.00    Types: Cigarettes   Smokeless tobacco: Never   Tobacco comments:    3 cigarettes/day  Substance and Sexual Activity   Alcohol use: Yes    Comment: Socially    Drug use: No   Sexual activity: Yes  Other Topics Concern   Not on file  Social History Narrative   Not on file   Social Determinants of Health   Financial Resource Strain: Not on file  Food Insecurity: Not on file  Transportation Needs:  Not on file  Physical Activity: Not on file  Stress: Not on file  Social Connections: Not on file  Intimate Partner Violence: Not on file      Review of Systems  Constitutional:  Negative for appetite change, chills, fatigue, fever and unexpected weight change.  Eyes:  Negative for visual disturbance.  Respiratory:  Negative for cough, chest tightness, shortness of breath and wheezing.   Cardiovascular:  Negative for chest pain and leg swelling.  Gastrointestinal:  Negative for  abdominal pain, constipation, diarrhea, nausea and vomiting.  Genitourinary:  Negative for dysuria, flank pain, frequency, genital sores, hematuria and urgency.  Skin:  Negative for rash.  Allergic/Immunologic: Negative for immunocompromised state.  Neurological:  Negative for dizziness and headaches.      Objective:    BP 119/82   Pulse 63   Temp 98 F (36.7 C) (Oral)   Wt 190 lb (86.2 kg)   SpO2 98%   BMI 25.77 kg/m  Nursing note and vital signs reviewed.  Physical Exam Constitutional:      General: He is not in acute distress.    Appearance: He is well-developed.  Eyes:     Conjunctiva/sclera: Conjunctivae normal.  Cardiovascular:     Rate and Rhythm: Normal rate and regular rhythm.     Heart sounds: Normal heart sounds. No murmur heard.   No friction rub. No gallop.  Pulmonary:     Effort: Pulmonary effort is normal. No respiratory distress.     Breath sounds: Normal breath sounds. No wheezing or rales.  Chest:     Chest wall: No tenderness.  Abdominal:     General: Bowel sounds are normal.     Palpations: Abdomen is soft.     Tenderness: There is no abdominal tenderness.  Musculoskeletal:     Cervical back: Neck supple.  Lymphadenopathy:     Cervical: No cervical adenopathy.  Skin:    General: Skin is warm and dry.     Findings: No rash.  Neurological:     Mental Status: He is alert and oriented to person, place, and time.  Psychiatric:        Behavior: Behavior normal.        Thought Content: Thought content normal.        Judgment: Judgment normal.        Assessment & Plan:   Patient Active Problem List   Diagnosis Date Noted   High risk sexual behavior 09/25/2020   On pre-exposure prophylaxis for HIV 09/25/2020     Problem List Items Addressed This Visit       Other   High risk sexual behavior    Mark Boone is a 30 y/o male at high risk for acquiring HIV disease and here today to for pre-exposure prophylaxis. Discussed the risks,  benefits, and financial aspects of PrEP. Will start on Descovy and is interested in Appertude. Pharmacy will determine coverage. Check lab work today include HIV, CMP, and STI. Start Descovy. Plan for follow up pending Apertude or in 3 months for Descovy refill. Discussed importance of safe sexual practice and condoms provided.        On pre-exposure prophylaxis for HIV - Primary   Relevant Orders   Hepatitis B surface antibody,qualitative   Hepatitis B surface antigen   HIV Antibody (routine testing w rflx)   Basic metabolic panel   RPR   Urine cytology ancillary only([facility])   Cytology (oral, anal, urethral) ancillary only   Cytology (oral, anal, urethral)  ancillary only     I have discontinued Earna Coder Neer's dolutegravir, ondansetron, elvitegravir-cobicistat-emtricitabine-tenofovir, elvitegravir-cobicistat-emtricitabine-tenofovir, hydrOXYzine, triamcinolone cream, elvitegravir-cobicistat-emtricitabine-tenofovir, HYDROcodone-acetaminophen, doxycycline, emtricitabine-tenofovir, raltegravir, and permethrin. I am also having him start on emtricitabine-tenofovir AF.   Meds ordered this encounter  Medications   emtricitabine-tenofovir AF (DESCOVY) 200-25 MG tablet    Sig: Take 1 tablet by mouth daily.    Dispense:  30 tablet    Refill:  11    Order Specific Question:   Supervising Provider    Answer:   Judyann Munson [[id number]]     Follow-up: Return in about 3 months (around 12/26/2020), or if symptoms worsen or fail to improve.    Marcos Eke, MSN, FNP-C Nurse Practitioner [facility] Main number: [phone]

## 2020-09-25 NOTE — Patient Instructions (Signed)
Nice to see you.  We will check your lab work today.  Start taking medication daily.  Cassie will be in contact regarding the injectable medication.

## 2020-09-26 ENCOUNTER — Other Ambulatory Visit (HOSPITAL_COMMUNITY): Payer: Self-pay

## 2020-09-26 LAB — BASIC METABOLIC PANEL
BUN: 8 mg/dL (ref 7–25)
CO2: 29 mmol/L (ref 20–32)
Calcium: 9.6 mg/dL (ref 8.6–10.3)
Chloride: 107 mmol/L (ref 98–110)
Creat: 0.91 mg/dL (ref 0.60–1.26)
Glucose, Bld: 85 mg/dL (ref 65–99)
Potassium: 4.5 mmol/L (ref 3.5–5.3)
Sodium: 141 mmol/L (ref 135–146)

## 2020-09-26 LAB — URINE CYTOLOGY ANCILLARY ONLY
Chlamydia: NEGATIVE
Comment: NEGATIVE
Comment: NORMAL
Neisseria Gonorrhea: NEGATIVE

## 2020-09-26 LAB — CYTOLOGY, (ORAL, ANAL, URETHRAL) ANCILLARY ONLY
Chlamydia: NEGATIVE
Chlamydia: NEGATIVE
Comment: NEGATIVE
Comment: NEGATIVE
Comment: NORMAL
Comment: NORMAL
Neisseria Gonorrhea: NEGATIVE
Neisseria Gonorrhea: NEGATIVE

## 2020-09-26 LAB — HIV ANTIBODY (ROUTINE TESTING W REFLEX): HIV 1&2 Ab, 4th Generation: NONREACTIVE

## 2020-09-26 LAB — HEPATITIS B SURFACE ANTIBODY,QUALITATIVE: Hep B S Ab: REACTIVE — AB

## 2020-09-26 LAB — RPR: RPR Ser Ql: NONREACTIVE

## 2020-09-26 LAB — HEPATITIS B SURFACE ANTIGEN: Hepatitis B Surface Ag: NONREACTIVE

## 2020-09-27 ENCOUNTER — Telehealth: Payer: Self-pay

## 2020-09-27 ENCOUNTER — Other Ambulatory Visit (HOSPITAL_COMMUNITY): Payer: Self-pay

## 2020-09-27 NOTE — Telephone Encounter (Signed)
Patient made aware of recent lab results. Advised patient of plan to follow up with Tammy Sours once Apertude approved by insurance. Patient verbalized understanding   Valarie Cones

## 2020-09-27 NOTE — Telephone Encounter (Signed)
-----   Message from Veryl Speak, FNP sent at 09/26/2020  5:22 PM EDT ----- Please inform Mr. Molzahn that his lab work is all negative. Will plan follow up for Apertude if approved by insurance.

## 2020-10-19 ENCOUNTER — Other Ambulatory Visit (HOSPITAL_COMMUNITY): Payer: Self-pay

## 2020-10-24 ENCOUNTER — Other Ambulatory Visit (HOSPITAL_COMMUNITY): Payer: Self-pay

## 2020-11-16 ENCOUNTER — Other Ambulatory Visit (HOSPITAL_COMMUNITY): Payer: Self-pay

## 2020-11-21 ENCOUNTER — Other Ambulatory Visit (HOSPITAL_COMMUNITY): Payer: Self-pay

## 2020-11-21 ENCOUNTER — Encounter (HOSPITAL_BASED_OUTPATIENT_CLINIC_OR_DEPARTMENT_OTHER): Payer: Self-pay

## 2020-11-21 ENCOUNTER — Emergency Department (HOSPITAL_BASED_OUTPATIENT_CLINIC_OR_DEPARTMENT_OTHER)
Admission: EM | Admit: 2020-11-21 | Discharge: 2020-11-21 | Disposition: A | Discharge status: Home / Self Care | Payer: Self-pay | Attending: Emergency Medicine | Admitting: Emergency Medicine

## 2020-11-21 ENCOUNTER — Other Ambulatory Visit: Payer: Self-pay

## 2020-11-21 DIAGNOSIS — K640 First degree hemorrhoids: Secondary | ICD-10-CM | POA: Insufficient documentation

## 2020-11-21 DIAGNOSIS — F17210 Nicotine dependence, cigarettes, uncomplicated: Secondary | ICD-10-CM | POA: Insufficient documentation

## 2020-11-21 MED ORDER — HYDROCORTISONE (PERIANAL) 2.5 % EX CREA: 1.0000 "application " | TOPICAL_CREAM | Freq: Two times a day (BID) | CUTANEOUS | 0 refills | Status: DC

## 2020-11-21 NOTE — ED Notes (Signed)
States having problems with hemorrhoids.  Denies problems having bowel movements.  Denies abdominal pain; nausea or vomiting

## 2020-11-21 NOTE — ED Triage Notes (Signed)
He reports recurrent hemorrhoids which "are real itchy" [sic]. He is in no distress.

## 2020-11-21 NOTE — Discharge Instructions (Addendum)
You were seen in the ER today for your rectal itching.  You were diagnosed with internal and external hemorrhoids.  You have been prescribed a rectal cream to insert 2 times a day.  Additionally recommend he perform sitz bath's, or you fill the bottom of the tub with a few inches of warm water and sit in it.  Please follow-up with your primary care doctor.  Abstain from intercourse for the next week.  Return to the ER with any new severe symptoms.

## 2020-11-21 NOTE — ED Provider Notes (Signed)
[facility] EMERGENCY DEPT Provider Note   CSN: [id number] Arrival date & time: 11/21/20  1726     History Chief Complaint  Patient presents with   Hemorrhoids    Mark Boone is a 30 y.o. male with history of hemorrhoids who presents with concern for 2 days of anal itching.  States that he was scrubbing at the area with a wet washcloth and feels he has irritated skin present now intermittently stings.  Endorses some bright red blood in his recent bowel movements but denies any melena or hematochezia.  Patient is sexually active with men and is a recipient at penetrative anal sex.  He is on PrEP and endorses consistent barrier protection use with condoms.  Denies any known skin lesions to the area or known exposures to monkey box or other STI.  Was recently screened at his most recent refill of his prep.  I personally read this patient's medical records.  He does not carry medical diagnoses and is on only Descovy daily.  HPI     History reviewed. No pertinent past medical history.  Patient Active Problem List   Diagnosis Date Noted   High risk sexual behavior 09/25/2020   On pre-exposure prophylaxis for HIV 09/25/2020    No past surgical history on file.     No family history on file.  Social History   Tobacco Use   Smoking status: Every Day    Types: Cigarettes   Smokeless tobacco: Never   Tobacco comments:    3 cigarettes/day  Substance Use Topics   Alcohol use: Yes    Comment: Socially    Drug use: No    Home Medications Prior to Admission medications   Medication Sig Start Date End Date Taking? Authorizing Provider  hydrocortisone (ANUSOL-HC) 2.5 % rectal cream Place 1 application rectally 2 (two) times daily. 11/21/20  Yes Mark Schaller R, PA-C  emtricitabine-tenofovir AF (DESCOVY) 200-25 MG tablet Take 1 tablet by mouth daily. 09/25/20   Mark Speak, FNP    Allergies    Patient has no known allergies.  Review of Systems    Review of Systems  Constitutional: Negative.   HENT: Negative.    Eyes: Negative.   Respiratory: Negative.    Cardiovascular: Negative.   Gastrointestinal:  Positive for anal bleeding. Negative for abdominal distention, abdominal pain, blood in stool, constipation, diarrhea, nausea, rectal pain and vomiting.       Anal itching  Genitourinary: Negative.   Musculoskeletal: Negative.   Skin: Negative.   Neurological: Negative.    Physical Exam Updated Vital Signs BP (!) 148/90   Pulse 89   Temp 98.8 F (37.1 C)   Resp 16   SpO2 97%   Physical Exam Vitals and nursing note reviewed. Exam conducted with a chaperone present.  Constitutional:      General: He is not in acute distress.    Appearance: He is not toxic-appearing.  HENT:     Head: Normocephalic and atraumatic.     Mouth/Throat:     Mouth: Mucous membranes are moist.     Pharynx: No oropharyngeal exudate or posterior oropharyngeal erythema.  Eyes:     General:        Right eye: No discharge.        Left eye: No discharge.     Conjunctiva/sclera: Conjunctivae normal.  Cardiovascular:     Rate and Rhythm: Normal rate and regular rhythm.     Pulses: Normal pulses.     Heart sounds:  Normal heart sounds. No murmur heard. Pulmonary:     Effort: Pulmonary effort is normal. No respiratory distress.     Breath sounds: Normal breath sounds. No wheezing or rales.  Abdominal:     General: Bowel sounds are normal. There is no distension.     Palpations: Abdomen is soft.     Tenderness: There is no abdominal tenderness. There is no right CVA tenderness, left CVA tenderness, guarding or rebound.  Genitourinary:    Rectum: External hemorrhoid and internal hemorrhoid present. No anal fissure. Normal anal tone.       Comments: Palpable internal hemorrhoids on rectal exam, nonmelanotic appearing stool on the gloved finger the provider after the exam.  There are no other skin lesions in the perianal area concerning for sexually  transmitted infection.  No anal fissure. Musculoskeletal:        General: No deformity.     Cervical back: Neck supple.  Skin:    General: Skin is warm and dry.  Neurological:     Mental Status: He is alert. Mental status is at baseline.  Psychiatric:        Mood and Affect: Mood normal.    ED Results / Procedures / Treatments   Labs (all labs ordered are listed, but only abnormal results are displayed) Labs Reviewed - No data to display  EKG None  Radiology No results found.  Procedures Procedures   Medications Ordered in ED Medications - No data to display  ED Course  I have reviewed the triage vital signs and the nursing notes.  Pertinent labs & imaging results that were available during my care of the patient were reviewed by me and considered in my medical decision making (see chart for details).    MDM Rules/Calculators/A&P                         30 year old male presents with concern for anal itching x2 days.  Differential diagnosis includes is not limited to internal/external hemorrhoids, constipation, anal fissure, sexually transmitted infection, cellulitis, perirectal abscess.  Hypertensive on intake and vital signs are normal.  Her pulmonary sounds normal, abdominal exam is benign.  GU exam as above with both external and internal hemorrhoids without other skin lesion to suggest sexually-transmitted infection.  No erythema, induration, or tenderness palpation externally to suggest abscess or cellulitis.  HPI and physical exam was consistent with hemorrhoids, will prescribe Anusol and recommend sitz bath's, which patient states he does receive significant relief from at home.  No further work-up warranted in the this time.  Patient will continue to follow-up with his primary care doctor.  [name] voiced understanding of his medical evaluation and treatment plan.  Each of his questions answered to his expressed inspection.  Return precautions given.  Patient is  well-appearing, stable, and appropriate for discharge at this time.  This chart was dictated using voice recognition software, Dragon. Despite the best efforts of this provider to proofread and correct errors, errors may still occur which can change documentation meaning.  Final Clinical Impression(s) / ED Diagnoses Final diagnoses:  Grade I hemorrhoids    Rx / DC Orders ED Discharge Orders          Ordered    hydrocortisone (ANUSOL-HC) 2.5 % rectal cream  2 times daily        11/21/20 2053             Graviela Nodal, Eugene Gavia, PA-C 11/21/20 2142    Freida Busman,  Ethelene Browns, MD 11/22/20 1248

## 2020-11-22 ENCOUNTER — Other Ambulatory Visit (HOSPITAL_COMMUNITY): Payer: Self-pay

## 2020-12-15 ENCOUNTER — Other Ambulatory Visit (HOSPITAL_COMMUNITY): Payer: Self-pay

## 2020-12-18 ENCOUNTER — Other Ambulatory Visit (HOSPITAL_COMMUNITY): Payer: Self-pay

## 2020-12-21 ENCOUNTER — Telehealth: Payer: Self-pay

## 2020-12-21 ENCOUNTER — Other Ambulatory Visit (HOSPITAL_COMMUNITY): Payer: Self-pay

## 2020-12-21 NOTE — Telephone Encounter (Addendum)
[facility] Patient Advocate Encounter  Completed and sent [employer] Advancing Access application for Descovy for this patient who is uninsured.    Patient is approved 12/21/20 through 12/21/21       Clearance Coots, CPhT Specialty Pharmacy Patient Advocate [facility] for Infectious Disease Phone: [phone] Fax:  [phone]

## 2020-12-22 ENCOUNTER — Telehealth: Payer: Self-pay

## 2020-12-22 NOTE — Telephone Encounter (Signed)
Patient called requesting letter to return for work. States he was tested and treated for monkeypox by [facility]. Advised he will need to reach out to the provider who treated him as we did not evaluate him for monkeypox. Patient verbalized understanding and has no further questions.   Sandie Ano, RN

## 2021-01-15 ENCOUNTER — Encounter (HOSPITAL_BASED_OUTPATIENT_CLINIC_OR_DEPARTMENT_OTHER): Payer: Self-pay | Admitting: Emergency Medicine

## 2021-01-15 ENCOUNTER — Emergency Department (HOSPITAL_BASED_OUTPATIENT_CLINIC_OR_DEPARTMENT_OTHER)
Admission: EM | Admit: 2021-01-15 | Discharge: 2021-01-15 | Disposition: A | Discharge status: Home / Self Care | Payer: Self-pay | Attending: Emergency Medicine | Admitting: Emergency Medicine

## 2021-01-15 ENCOUNTER — Other Ambulatory Visit: Payer: Self-pay

## 2021-01-15 DIAGNOSIS — R197 Diarrhea, unspecified: Secondary | ICD-10-CM | POA: Insufficient documentation

## 2021-01-15 DIAGNOSIS — F17210 Nicotine dependence, cigarettes, uncomplicated: Secondary | ICD-10-CM | POA: Insufficient documentation

## 2021-01-15 DIAGNOSIS — Z21 Asymptomatic human immunodeficiency virus [HIV] infection status: Secondary | ICD-10-CM | POA: Insufficient documentation

## 2021-01-15 LAB — CBC
HCT: 39.9 % (ref 39.0–52.0)
Hemoglobin: 13 g/dL (ref 13.0–17.0)
MCH: 31.3 pg (ref 26.0–34.0)
MCHC: 32.6 g/dL (ref 30.0–36.0)
MCV: 95.9 fL (ref 80.0–100.0)
Platelets: 209 10*3/uL (ref 150–400)
RBC: 4.16 MIL/uL — ABNORMAL LOW (ref 4.22–5.81)
RDW: 12.1 % (ref 11.5–15.5)
WBC: 7.2 10*3/uL (ref 4.0–10.5)
nRBC: 0 % (ref 0.0–0.2)

## 2021-01-15 LAB — COMPREHENSIVE METABOLIC PANEL
ALT: 10 U/L (ref 0–44)
AST: 16 U/L (ref 15–41)
Albumin: 3.9 g/dL (ref 3.5–5.0)
Alkaline Phosphatase: 57 U/L (ref 38–126)
Anion gap: 9 (ref 5–15)
BUN: 10 mg/dL (ref 6–20)
CO2: 26 mmol/L (ref 22–32)
Calcium: 9.2 mg/dL (ref 8.9–10.3)
Chloride: 104 mmol/L (ref 98–111)
Creatinine, Ser: 0.91 mg/dL (ref 0.61–1.24)
GFR, Estimated: >60 mL/min (ref >60)
Glucose, Bld: 136 mg/dL — ABNORMAL HIGH (ref 70–99)
Potassium: 3.7 mmol/L (ref 3.5–5.1)
Sodium: 139 mmol/L (ref 135–145)
Total Bilirubin: 0.6 mg/dL (ref 0.3–1.2)
Total Protein: 7.4 g/dL (ref 6.5–8.1)

## 2021-01-15 LAB — LIPASE, BLOOD: Lipase: 21 U/L (ref 11–51)

## 2021-01-15 MED ORDER — AZITHROMYCIN 250 MG PO TABS: 250.0000 mg | ORAL_TABLET | Freq: Every day | ORAL | 0 refills | Status: DC

## 2021-01-15 NOTE — ED Notes (Signed)
RT Note: Pt. unable to be located in the lobby.

## 2021-01-15 NOTE — Discharge Instructions (Signed)
You were seen in the emergency department for 8 days of diarrhea.  Your lab work did not show any significant abnormalities.  We are starting you on some antibiotics.  You can also try some over-the-counter Imodium.  Please follow-up with your primary care doctor.  Return to the emergency department if any worsening or concerning symptoms.

## 2021-01-15 NOTE — ED Provider Notes (Signed)
[facility] EMERGENCY DEPT Provider Note   CSN: [id number] Arrival date & time: 01/15/21  1412     History Chief Complaint  Patient presents with   Diarrhea    Mark Boone is a 30 y.o. male.  He has no medical history but is on preexposure prophylaxis for HIV.  Complaining of 1 week of diarrhea.  5 episodes a day.  Intermittently abdominal cramping when he stools but none in between.  He seen a little bit of blood when he wipes.  No fevers or chills.  No recent travel or unusual food exposures.  The history is provided by the patient.  Diarrhea Quality:  Watery Severity:  Moderate Onset quality:  Gradual Number of episodes:  40 Duration:  8 days Timing:  Intermittent Progression:  Unchanged Relieved by:  None tried Worsened by:  Nothing Ineffective treatments:  None tried Associated symptoms: no abdominal pain, no chills, no recent cough, no fever, no headaches and no vomiting   Risk factors: no recent antibiotic use, no sick contacts, no suspicious food intake and no travel to endemic areas       History reviewed. No pertinent past medical history.  Patient Active Problem List   Diagnosis Date Noted   High risk sexual behavior 09/25/2020   On pre-exposure prophylaxis for HIV 09/25/2020    History reviewed. No pertinent surgical history.     History reviewed. No pertinent family history.  Social History   Tobacco Use   Smoking status: Every Day    Types: Cigarettes   Smokeless tobacco: Never   Tobacco comments:    3 cigarettes/day  Substance Use Topics   Alcohol use: Yes    Comment: Socially    Drug use: No    Home Medications Prior to Admission medications   Medication Sig Start Date End Date Taking? Authorizing Provider  emtricitabine-tenofovir AF (DESCOVY) 200-25 MG tablet Take 1 tablet by mouth daily. 09/25/20   Golden Circle, FNP  hydrocortisone (ANUSOL-HC) 2.5 % rectal cream Place 1 application rectally 2 (two) times  daily. 11/21/20   Sponseller, Gypsy Balsam, PA-C    Allergies    Patient has no known allergies.  Review of Systems   Review of Systems  Constitutional:  Negative for chills and fever.  HENT:  Negative for sore throat.   Eyes:  Negative for visual disturbance.  Respiratory:  Negative for shortness of breath.   Cardiovascular:  Negative for chest pain.  Gastrointestinal:  Positive for diarrhea. Negative for abdominal pain and vomiting.  Genitourinary:  Negative for dysuria.  Musculoskeletal:  Negative for back pain.  Skin:  Negative for rash.  Neurological:  Negative for headaches.   Physical Exam Updated Vital Signs BP 134/80 (BP Location: Right Arm)   Pulse 68   Temp 98.4 F (36.9 C) (Oral)   Resp 16   Ht 6' (1.829 m)   Wt 95.3 kg   SpO2 100%   BMI 28.48 kg/m   Physical Exam Vitals and nursing note reviewed.  Constitutional:      Appearance: Normal appearance. He is well-developed.  HENT:     Head: Normocephalic and atraumatic.  Eyes:     Conjunctiva/sclera: Conjunctivae normal.  Cardiovascular:     Rate and Rhythm: Normal rate and regular rhythm.     Heart sounds: No murmur heard. Pulmonary:     Effort: Pulmonary effort is normal. No respiratory distress.     Breath sounds: Normal breath sounds.  Abdominal:     Palpations: Abdomen  is soft.     Tenderness: There is no abdominal tenderness. There is no guarding or rebound.  Musculoskeletal:        General: No deformity or signs of injury. Normal range of motion.     Cervical back: Neck supple.  Skin:    General: Skin is warm and dry.  Neurological:     General: No focal deficit present.     Mental Status: He is alert.    ED Results / Procedures / Treatments   Labs (all labs ordered are listed, but only abnormal results are displayed) Labs Reviewed  COMPREHENSIVE METABOLIC PANEL - Abnormal; Notable for the following components:      Result Value   Glucose, Bld 136 (*)    All other components within normal  limits  CBC - Abnormal; Notable for the following components:   RBC 4.16 (*)    All other components within normal limits  LIPASE, BLOOD    EKG None  Radiology No results found.  Procedures Procedures   Medications Ordered in ED Medications - No data to display  ED Course  I have reviewed the triage vital signs and the nursing notes.  Pertinent labs & imaging results that were available during my care of the patient were reviewed by me and considered in my medical decision making (see chart for details).    MDM Rules/Calculators/A&P                          This patient complains of diarrhea x8 days; this involves an extensive number of treatment Options and is a complaint that carries with it a high risk of complications and Morbidity. The differential includes infectious diarrhea, metabolic derangement, medication intolerance, dehydration  I ordered, reviewed and interpreted labs, which included CBC with normal white count normal hemoglobin, chemistries and LFTs fairly normal other than mildly elevated glucose Previous records obtained and reviewed in epic no recent admissions  After the interventions stated above, I reevaluated the patient and found him to be hemodynamically stable with benign abdominal exam.  Ordered stool culture although patient unable to provide a sample.  Will cover with antibiotic for presumptive infectious diarrhea.  Return instructions discussed.   Final Clinical Impression(s) / ED Diagnoses Final diagnoses:  Diarrhea of presumed infectious origin    Rx / DC Orders ED Discharge Orders          Ordered    azithromycin (ZITHROMAX) 250 MG tablet  Daily        01/15/21 1932             Terrilee Files, MD 01/16/21 1031

## 2021-01-15 NOTE — ED Notes (Signed)
Pt attempting to provide stool sample at this time.

## 2021-01-15 NOTE — ED Triage Notes (Signed)
Pt arrives to ED withy c/o of diarrhea. The diarrhea started x8 days ago on 10/23. The diarrhea is daily. >5 episodes of diarrhea daily. He has intermittent lower abdominal pain that resolves when he defecates. No fever or chills. No dysuria, hematuria.

## 2021-01-15 NOTE — ED Notes (Signed)
Not able to collect stool specimen. Provider notified and pt discharged

## 2021-01-24 ENCOUNTER — Other Ambulatory Visit (HOSPITAL_COMMUNITY): Payer: Self-pay

## 2021-01-30 ENCOUNTER — Ambulatory Visit: Payer: Self-pay | Admitting: Pharmacist

## 2021-02-13 ENCOUNTER — Telehealth: Payer: Self-pay

## 2021-02-13 NOTE — Telephone Encounter (Signed)
Called patient about getting a follow up prep appointment scheduled, left patient a voicemail to call us to get scheduled.

## 2021-02-16 ENCOUNTER — Other Ambulatory Visit (HOSPITAL_COMMUNITY): Payer: Self-pay

## 2021-02-19 ENCOUNTER — Other Ambulatory Visit (HOSPITAL_COMMUNITY): Payer: Self-pay

## 2021-02-20 ENCOUNTER — Other Ambulatory Visit (HOSPITAL_COMMUNITY): Payer: Self-pay

## 2021-03-15 ENCOUNTER — Other Ambulatory Visit (HOSPITAL_COMMUNITY): Payer: Self-pay

## 2021-03-19 ENCOUNTER — Other Ambulatory Visit (HOSPITAL_COMMUNITY): Payer: Self-pay

## 2021-04-11 ENCOUNTER — Other Ambulatory Visit (HOSPITAL_COMMUNITY): Payer: Self-pay

## 2021-05-07 ENCOUNTER — Other Ambulatory Visit (HOSPITAL_COMMUNITY): Payer: Self-pay

## 2021-05-08 ENCOUNTER — Ambulatory Visit: Payer: Self-pay | Admitting: Pharmacist

## 2021-05-08 ENCOUNTER — Other Ambulatory Visit (HOSPITAL_COMMUNITY): Payer: Self-pay

## 2021-05-10 ENCOUNTER — Emergency Department (HOSPITAL_COMMUNITY)
Admission: EM | Admit: 2021-05-10 | Discharge: 2021-05-10 | Disposition: A | Discharge status: Home / Self Care | Payer: BC Managed Care – PPO | Attending: Emergency Medicine | Admitting: Emergency Medicine

## 2021-05-10 ENCOUNTER — Other Ambulatory Visit: Payer: Self-pay

## 2021-05-10 ENCOUNTER — Encounter (HOSPITAL_COMMUNITY): Payer: Self-pay | Admitting: *Deleted

## 2021-05-10 DIAGNOSIS — Z202 Contact with and (suspected) exposure to infections with a predominantly sexual mode of transmission: Secondary | ICD-10-CM | POA: Insufficient documentation

## 2021-05-10 LAB — RAPID HIV SCREEN (HIV 1/2 AB+AG)
HIV 1/2 Antibodies: NONREACTIVE
HIV-1 P24 Antigen - HIV24: NONREACTIVE

## 2021-05-10 MED ORDER — CEFTRIAXONE SODIUM 1 G IJ SOLR
1.0000 g | Freq: Once | INTRAMUSCULAR | Status: AC
Start: 2021-05-10 — End: 2021-05-10
  Administered 2021-05-10: 1 g via INTRAMUSCULAR
  Filled 2021-05-10: qty 10

## 2021-05-10 MED ORDER — LIDOCAINE HCL 1 % IJ SOLN
INTRAMUSCULAR | Status: AC
  Administered 2021-05-10: 20 mL
  Filled 2021-05-10: qty 20

## 2021-05-10 NOTE — ED Triage Notes (Signed)
Pt states he has no symptoms but partner tested + for Gonorrhea.

## 2021-05-10 NOTE — Discharge Instructions (Addendum)
You were seen in the emergency department for STD testing.   As we discussed, we will not have the results of your testing today. We have given you a one time dose of antibiotics for gonorrhea. You can follow up the results of your testing on MyChart.   If you test positive for HIV, I have given you the contact information for the Infectious Disease specialists for you to call and establish care.  For any further STD testing you can get free and anonymous testing at the health department. You can make appointments at the [facility] Department if you call this number [phone].  You can also follow up with them with any positive tests to receive any further treatment.

## 2021-05-10 NOTE — ED Provider Notes (Signed)
[facility] DEPT Provider Note   CSN: [id number] Arrival date & time: 05/10/21  1356     History  Chief Complaint  Patient presents with   SEXUALLY TRANSMITTED DISEASE    Mark Boone is a 31 y.o. male who presents to the emergency department requesting STD testing.  Patient states he has no symptoms, but his partner tested positive for gonorrhea.  He is requesting testing of his urine, anal swabs, oral swabs, HIV and syphilis testing.  HPI     Home Medications Prior to Admission medications   Medication Sig Start Date End Date Taking? Authorizing Provider  azithromycin (ZITHROMAX) 250 MG tablet Take 1 tablet (250 mg total) by mouth daily. Take first 2 tablets together, then 1 every day until finished. 01/15/21   Hayden Rasmussen, MD  emtricitabine-tenofovir AF (DESCOVY) 200-25 MG tablet Take 1 tablet by mouth daily. 09/25/20   Golden Circle, FNP  hydrocortisone (ANUSOL-HC) 2.5 % rectal cream Place 1 application rectally 2 (two) times daily. 11/21/20   Sponseller, Gypsy Balsam, PA-C      Allergies    Patient has no known allergies.    Review of Systems   Review of Systems  Constitutional:  Negative for chills and fever.  Gastrointestinal:  Negative for abdominal pain, nausea and vomiting.  Genitourinary:  Negative for dysuria, hematuria, penile discharge, scrotal swelling and testicular pain.  All other systems reviewed and are negative.  Physical Exam Updated Vital Signs BP (!) 158/82 (BP Location: Right Arm)    Pulse 66    Temp 98.6 F (37 C) (Oral)    Resp 16    Ht 6' (1.829 m)    Wt 95.3 kg    SpO2 100%    BMI 28.48 kg/m  Physical Exam Vitals and nursing note reviewed.  Constitutional:      Appearance: Normal appearance.  HENT:     Head: Normocephalic and atraumatic.  Eyes:     Conjunctiva/sclera: Conjunctivae normal.  Pulmonary:     Effort: Pulmonary effort is normal. No respiratory distress.  Skin:    General: Skin is  warm and dry.  Neurological:     Mental Status: He is alert.  Psychiatric:        Mood and Affect: Mood normal.        Behavior: Behavior normal.    ED Results / Procedures / Treatments   Labs (all labs ordered are listed, but only abnormal results are displayed) Labs Reviewed  RAPID HIV SCREEN (HIV 1/2 AB+AG)  RPR  GC/CHLAMYDIA PROBE AMP ([place]) NOT AT [facility]  GC/CHLAMYDIA PROBE AMP ([place]) NOT AT [facility]  GC/CHLAMYDIA PROBE AMP ([place]) NOT AT [facility]    EKG None  Radiology No results found.  Procedures Procedures    Medications Ordered in ED Medications  cefTRIAXone (ROCEPHIN) injection 1 g (1 g Intramuscular Given 05/10/21 1545)  lidocaine (XYLOCAINE) 1 % (with pres) injection (20 mLs  Given 05/10/21 1545)    ED Course/ Medical Decision Making/ A&P                           Medical Decision Making Amount and/or Complexity of Data Reviewed Labs: ordered.  Risk Prescription drug management.   Patient is a 31 year old male who presents to the emergency department requesting STD testing.  Patient has no symptoms but states that his partner tested positive for gonorrhea.  We collected testing to include cytology of urine, anal  swab, and oral swab for gonorrhea and chlamydia.  Obtained labs for HIV and syphilis.  Patient given one-time dose of Rocephin for gonorrhea prophylaxis.  Patient understands to follow-up on his results.  He understands that if he has any positive test, he can go to the health department to receive necessary further treatment.  We discussed reasons to return to the emergency department, patient is agreeable to the plan.  Final Clinical Impression(s) / ED Diagnoses Final diagnoses:  Exposure to STD    Rx / DC Orders ED Discharge Orders     None      Portions of this report may have been transcribed using voice recognition software. Every effort was made to ensure accuracy; however, inadvertent computerized transcription  errors may be present.    Tanee Henery T, PA-C 05/10/21 2113    Lorelle Gibbs, DO 05/11/21 1515

## 2021-05-11 LAB — RPR: RPR Ser Ql: NONREACTIVE

## 2021-06-04 ENCOUNTER — Other Ambulatory Visit (HOSPITAL_COMMUNITY): Payer: Self-pay

## 2021-06-06 ENCOUNTER — Other Ambulatory Visit (HOSPITAL_COMMUNITY): Payer: Self-pay

## 2021-06-21 ENCOUNTER — Other Ambulatory Visit (HOSPITAL_COMMUNITY): Payer: Self-pay

## 2021-07-02 ENCOUNTER — Other Ambulatory Visit (HOSPITAL_COMMUNITY): Payer: Self-pay

## 2021-07-24 ENCOUNTER — Other Ambulatory Visit (HOSPITAL_COMMUNITY): Payer: Self-pay

## 2021-07-26 ENCOUNTER — Other Ambulatory Visit (HOSPITAL_COMMUNITY): Payer: Self-pay

## 2021-08-07 ENCOUNTER — Encounter: Payer: Self-pay | Admitting: Pharmacist

## 2021-08-21 ENCOUNTER — Other Ambulatory Visit (HOSPITAL_COMMUNITY): Payer: Self-pay

## 2021-08-27 ENCOUNTER — Other Ambulatory Visit (HOSPITAL_COMMUNITY): Payer: Self-pay

## 2021-08-28 ENCOUNTER — Other Ambulatory Visit (HOSPITAL_COMMUNITY): Payer: Self-pay

## 2021-08-28 ENCOUNTER — Telehealth: Payer: Self-pay

## 2021-08-28 ENCOUNTER — Ambulatory Visit (INDEPENDENT_AMBULATORY_CARE_PROVIDER_SITE_OTHER): Payer: Self-pay | Admitting: Pharmacist

## 2021-08-28 ENCOUNTER — Other Ambulatory Visit: Payer: Self-pay

## 2021-08-28 DIAGNOSIS — Z113 Encounter for screening for infections with a predominantly sexual mode of transmission: Secondary | ICD-10-CM

## 2021-08-28 DIAGNOSIS — Z79899 Other long term (current) drug therapy: Secondary | ICD-10-CM

## 2021-08-28 MED ORDER — EMTRICITABINE-TENOFOVIR AF 200-25 MG PO TABS
1.0000 | ORAL_TABLET | Freq: Every day | ORAL | 2 refills | Status: DC
  Filled 2021-08-28: qty 30, 30d supply, fill #0
  Filled 2021-09-19: qty 30, 30d supply, fill #1
  Filled 2021-10-10: qty 30, 30d supply, fill #2

## 2021-08-28 NOTE — Progress Notes (Signed)
Date:  08/28/2021   HPI: Mark Boone is a 31 y.o. male who presents to the [facility] pharmacy clinic for HIV PrEP follow-up.  Insured   [x]    Uninsured  []    Patient Active Problem List   Diagnosis Date Noted   High risk sexual behavior 09/25/2020   On pre-exposure prophylaxis for HIV 09/25/2020    Patient's Medications  New Prescriptions   No medications on file  Previous Medications   AZITHROMYCIN (ZITHROMAX) 250 MG TABLET    Take 1 tablet (250 mg total) by mouth daily. Take first 2 tablets together, then 1 every day until finished.   EMTRICITABINE-TENOFOVIR AF (DESCOVY) 200-25 MG TABLET    Take 1 tablet by mouth daily.   HYDROCORTISONE (ANUSOL-HC) 2.5 % RECTAL CREAM    Place 1 application rectally 2 (two) times daily.  Modified Medications   No medications on file  Discontinued Medications   No medications on file    Allergies: No Known Allergies  Past Medical History: No past medical history on file.  Social History: Social History   Socioeconomic History   Marital status: Single    Spouse name: Not on file   Number of children: Not on file   Years of education: Not on file   Highest education level: Not on file  Occupational History   Not on file  Tobacco Use   Smoking status: Former    Types: Cigarettes   Smokeless tobacco: Never   Tobacco comments:    3 cigarettes/day  Vaping Use   Vaping Use: Some days  Substance and Sexual Activity   Alcohol use: Yes    Comment: Socially    Drug use: No   Sexual activity: Yes  Other Topics Concern   Not on file  Social History Narrative   Not on file   Social Determinants of Health   Financial Resource Strain: Not on file  Food Insecurity: Not on file  Transportation Needs: Not on file  Physical Activity: Not on file  Stress: Not on file  Social Connections: Not on file        No data to display          Labs:  SCr: Lab Results  Component Value Date   CREATININE 0.91 01/15/2021    CREATININE 0.91 09/25/2020   CREATININE 1.11 05/17/2020   CREATININE 0.87 03/10/2020   CREATININE 1.00 08/09/2017   HIV Lab Results  Component Value Date   HIV NON REACTIVE 05/10/2021   HIV NON-REACTIVE 09/25/2020   HIV NON REACTIVE 07/05/2020   HIV Non Reactive 03/10/2020   HIV Non Reactive 12/23/2019   HIV NON REACTIVE 12/23/2019   Hepatitis B Lab Results  Component Value Date   HEPBSAB REACTIVE (A) 09/25/2020   HEPBSAG NON-REACTIVE 09/25/2020   Hepatitis C No results found for: "HEPCAB", "HCVRNAPCRQN" Hepatitis A No results found for: "HAV" RPR and STI Lab Results  Component Value Date   LABRPR NON REACTIVE 05/10/2021   LABRPR NON-REACTIVE 09/25/2020   LABRPR NON REACTIVE 03/10/2020   LABRPR NON REACTIVE 05/25/2019   LABRPR Non Reactive 08/09/2017    STI Results GC CT  09/25/2020  2:44 PM Negative    Negative    Negative  Negative    Negative    Negative   07/05/2020  9:04 PM Negative  Negative   12/23/2019  2:07 PM Negative  Negative   05/25/2019 12:00 AM Negative  Negative   05/30/2017 12:00 AM Negative  Negative   03/22/2015 12:00  AM Negative  Negative     Assessment: Mark Boone is here today to follow up for HIV PrEP. He was last seen by Marcos Eke, ID NP in July 2022 and has not followed up since. Descovy prescription was mistakenly sent in for a year's supply (supposed to only be a 90 day supply) so patient has been taking for almost year without follow up. He states that he has one male partner and uses condoms 100% of the time. Versatile sexual preference including oral sex as well. He has used PEP before but has not engaged in IVDU or stimulants. His one partner is HIV negative, as far as he knows. He has never been diagnosed with an STI before. Screened for acute HIV symptoms such as fatigue, muscle aches, rash, sore throat, lymphadenopathy, headache, night sweats, nausea/vomiting/diarrhea, and fever. Denies any symptoms.   He states that he does not miss  any doses of his Descovy and takes it every day. He is complaining of a 30 lb weight gain since starting in July of last year. Descovy can cause weight gain, so I discussed this with him. Discussed Apretude and Truvada, including that Truvada is not associated with weight gain. He states that he has had friends that have contracted HIV who are receiving Apretude. I explained the risk with this, as well as the risk with Descovy and Truvada. Also explained the process for Apretude and the need to come to appointments and not miss them. He is interested but wants to wait until his next follow up in September to start. Will have Lupita Leash start the PA to get it approved on his insurance. He did not want to try Truvada for fear of tolerability issues.   Will check all labs today and see him back in 3 months. Will send in Descovy refills today as he only has 2 tablets left and gets it mailed from [facility].   Plan: - HIV antibody, RPR, urine/rectal/pharyngeal GC/CT swabs for cytology today - Descovy x 3 months if HIV negative - F/u with me on 9/7 at 1130am  Layman Gully L. Semaj Coburn, PharmD, BCIDP, AAHIVP, CPP Clinical Pharmacist Practitioner Infectious Diseases Clinical Pharmacist [facility] 08/28/2021, 11:51 AM

## 2021-08-28 NOTE — Telephone Encounter (Signed)
[facility] Patient Advocate Encounter   Received notification from [employer] that prior authorization for Apretude is required.   PA submitted on 08/28/21  Key [id number] Status is pending    [facility] will continue to follow.   Ileene Patrick, [facility] Patient [facility] Phone: [phone] Fax:  [phone]

## 2021-08-29 LAB — CYTOLOGY, (ORAL, ANAL, URETHRAL) ANCILLARY ONLY
Chlamydia: NEGATIVE
Chlamydia: NEGATIVE
Comment: NEGATIVE
Comment: NEGATIVE
Comment: NORMAL
Comment: NORMAL
Neisseria Gonorrhea: NEGATIVE
Neisseria Gonorrhea: NEGATIVE

## 2021-08-29 LAB — URINE CYTOLOGY ANCILLARY ONLY
Chlamydia: NEGATIVE
Comment: NEGATIVE
Comment: NORMAL
Neisseria Gonorrhea: NEGATIVE

## 2021-08-29 LAB — HIV ANTIBODY (ROUTINE TESTING W REFLEX): HIV 1&2 Ab, 4th Generation: NONREACTIVE

## 2021-08-29 LAB — RPR: RPR Ser Ql: NONREACTIVE

## 2021-08-31 ENCOUNTER — Telehealth: Payer: Self-pay

## 2021-08-31 NOTE — Telephone Encounter (Addendum)
[facility] Patient Advocate Encounter  Received notification from [facility] that the request for prior authorization for Apretude has been denied due to patient will need have tried all formulary alternatives.   Truvada can be sent to [facility].  This encounter will continue to be updated until final determination.    Clearance Coots, CPhT Specialty Pharmacy Patient [facility] for [facility] Phone: [phone] Fax:  [phone]

## 2021-09-18 ENCOUNTER — Encounter (HOSPITAL_COMMUNITY): Payer: Self-pay

## 2021-09-18 ENCOUNTER — Emergency Department (HOSPITAL_COMMUNITY)
Admission: EM | Admit: 2021-09-18 | Discharge: 2021-09-18 | Disposition: A | Discharge status: Home / Self Care | Payer: BC Managed Care – PPO | Attending: Emergency Medicine | Admitting: Emergency Medicine

## 2021-09-18 ENCOUNTER — Emergency Department (HOSPITAL_COMMUNITY): Payer: BC Managed Care – PPO

## 2021-09-18 DIAGNOSIS — Z23 Encounter for immunization: Secondary | ICD-10-CM | POA: Insufficient documentation

## 2021-09-18 DIAGNOSIS — S1191XA Laceration without foreign body of unspecified part of neck, initial encounter: Secondary | ICD-10-CM

## 2021-09-18 DIAGNOSIS — S1181XA Laceration without foreign body of other specified part of neck, initial encounter: Secondary | ICD-10-CM | POA: Diagnosis not present

## 2021-09-18 DIAGNOSIS — S199XXA Unspecified injury of neck, initial encounter: Secondary | ICD-10-CM | POA: Diagnosis present

## 2021-09-18 MED ORDER — LIDOCAINE HCL (PF) 1 % IJ SOLN
10.0000 mL | Freq: Once | INTRAMUSCULAR | Status: AC
  Administered 2021-09-18: 10 mL
  Filled 2021-09-18: qty 30

## 2021-09-18 MED ORDER — TETANUS-DIPHTH-ACELL PERTUSSIS 5-2.5-18.5 LF-MCG/0.5 IM SUSY
0.5000 mL | PREFILLED_SYRINGE | Freq: Once | INTRAMUSCULAR | Status: AC
  Administered 2021-09-18: 0.5 mL via INTRAMUSCULAR
  Filled 2021-09-18: qty 0.5
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
## 2021-09-18 NOTE — ED Notes (Signed)
Patient transported to X-ray 

## 2021-09-18 NOTE — Discharge Instructions (Signed)
As we discussed, 8 nonabsorbable sutures were placed in your neck today.  These will need to be removed in 7 days.  This can be done at your primary care office, urgent care, or you can return here for this procedure.  In the interim, I recommend that you rinse your wounds with soap and water and be sure to pat dry, do not scrub.  Monitor for signs of infection including yellow drainage, warmth, or redness.  These would be reasons to return to the emergency department for further evaluation.  Return if development of any new or worsening symptoms.

## 2021-09-19 ENCOUNTER — Other Ambulatory Visit (HOSPITAL_COMMUNITY): Payer: Self-pay

## 2021-09-20 ENCOUNTER — Other Ambulatory Visit (HOSPITAL_COMMUNITY): Payer: Self-pay

## 2021-09-27 ENCOUNTER — Other Ambulatory Visit: Payer: Self-pay

## 2021-09-27 ENCOUNTER — Emergency Department (HOSPITAL_COMMUNITY)
Admission: EM | Admit: 2021-09-27 | Discharge: 2021-09-27 | Disposition: A | Discharge status: Home / Self Care | Payer: BC Managed Care – PPO | Attending: Emergency Medicine | Admitting: Emergency Medicine

## 2021-09-27 ENCOUNTER — Encounter (HOSPITAL_COMMUNITY): Payer: Self-pay | Admitting: Emergency Medicine

## 2021-09-27 DIAGNOSIS — S1191XD Laceration without foreign body of unspecified part of neck, subsequent encounter: Secondary | ICD-10-CM | POA: Diagnosis present

## 2021-09-27 DIAGNOSIS — Z4802 Encounter for removal of sutures: Secondary | ICD-10-CM | POA: Diagnosis not present

## 2021-09-27 NOTE — Discharge Instructions (Signed)
Please read attached guide concerning scar tissue Please return to the ED for any new concerns or symptoms

## 2021-09-27 NOTE — ED Notes (Signed)
Delice Bison, PA discharged patient, per PA pt received AVS and discharge instructions.

## 2021-09-27 NOTE — ED Triage Notes (Signed)
Patient arrives ambulatory for suture removal to left neck.
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
## 2021-10-10 ENCOUNTER — Other Ambulatory Visit (HOSPITAL_COMMUNITY): Payer: Self-pay

## 2021-10-16 ENCOUNTER — Other Ambulatory Visit (HOSPITAL_COMMUNITY): Payer: Self-pay

## 2021-11-12 ENCOUNTER — Other Ambulatory Visit: Payer: Self-pay | Admitting: Pharmacist

## 2021-11-12 ENCOUNTER — Other Ambulatory Visit (HOSPITAL_COMMUNITY): Payer: Self-pay

## 2021-11-12 DIAGNOSIS — Z79899 Other long term (current) drug therapy: Secondary | ICD-10-CM

## 2021-11-13 ENCOUNTER — Other Ambulatory Visit (HOSPITAL_COMMUNITY): Payer: Self-pay

## 2021-11-14 ENCOUNTER — Ambulatory Visit (INDEPENDENT_AMBULATORY_CARE_PROVIDER_SITE_OTHER): Payer: BC Managed Care – PPO | Admitting: Pharmacist

## 2021-11-14 ENCOUNTER — Other Ambulatory Visit (HOSPITAL_COMMUNITY)
Admission: RE | Admit: 2021-11-14 | Discharge: 2021-11-14 | Disposition: A | Discharge status: Home / Self Care | Payer: BC Managed Care – PPO | Source: Ambulatory Visit | Attending: Family | Admitting: Family

## 2021-11-14 ENCOUNTER — Other Ambulatory Visit: Payer: Self-pay

## 2021-11-14 ENCOUNTER — Other Ambulatory Visit (HOSPITAL_COMMUNITY): Payer: Self-pay

## 2021-11-14 DIAGNOSIS — Z113 Encounter for screening for infections with a predominantly sexual mode of transmission: Secondary | ICD-10-CM | POA: Insufficient documentation

## 2021-11-14 DIAGNOSIS — Z79899 Other long term (current) drug therapy: Secondary | ICD-10-CM

## 2021-11-14 MED ORDER — EMTRICITABINE-TENOFOVIR AF 200-25 MG PO TABS
1.0000 | ORAL_TABLET | Freq: Every day | ORAL | 2 refills | Status: DC
  Filled 2021-11-14: qty 30, 30d supply, fill #0
  Filled 2021-12-07: qty 30, 30d supply, fill #1
  Filled 2021-12-27: qty 30, 30d supply, fill #2

## 2021-11-14 NOTE — Progress Notes (Signed)
Date:  11/14/2021   HPI: Mark Boone is a 31 y.o. male who presents to the [facility] pharmacy clinic for HIV PrEP follow-up.  Insured   [x]    Uninsured  []    Patient Active Problem List   Diagnosis Date Noted   High risk sexual behavior 09/25/2020   On pre-exposure prophylaxis for HIV 09/25/2020    Patient's Medications  New Prescriptions   No medications on file  Previous Medications   AZITHROMYCIN (ZITHROMAX) 250 MG TABLET    Take 1 tablet (250 mg total) by mouth daily. Take first 2 tablets together, then 1 every day until finished.   EMTRICITABINE-TENOFOVIR AF (DESCOVY) 200-25 MG TABLET    Take 1 tablet by mouth daily.   HYDROCORTISONE (ANUSOL-HC) 2.5 % RECTAL CREAM    Place 1 application rectally 2 (two) times daily.  Modified Medications   No medications on file  Discontinued Medications   No medications on file    Allergies: No Known Allergies  Past Medical History: No past medical history on file.  Social History: Social History   Socioeconomic History   Marital status: Single    Spouse name: Not on file   Number of children: Not on file   Years of education: Not on file   Highest education level: Not on file  Occupational History   Not on file  Tobacco Use   Smoking status: Former    Types: Cigarettes   Smokeless tobacco: Never   Tobacco comments:    3 cigarettes/day  Vaping Use   Vaping Use: Some days  Substance and Sexual Activity   Alcohol use: Yes    Comment: Socially    Drug use: No   Sexual activity: Yes  Other Topics Concern   Not on file  Social History Narrative   Not on file   Social Determinants of Health   Financial Resource Strain: Not on file  Food Insecurity: Not on file  Transportation Needs: Not on file  Physical Activity: Not on file  Stress: Not on file  Social Connections: Not on file       08/28/2021   11:51 AM  [facility] HIV PREP FLOWSHEET RESULTS  Insurance Status Uninsured  Gender at birth Male  Gender identity  cis-Male  Risk for HIV Hx of nPEP use  Sex Partners Men only  # sex partners past 3-6 mos 1  Sex activity preferences Insertive and receptive;Oral  Condom use Yes  % condom use 100  Treated for STI? No  HIV symptoms? None    Labs:  SCr: Lab Results  Component Value Date   CREATININE 0.91 01/15/2021   CREATININE 0.91 09/25/2020   CREATININE 1.11 05/17/2020   CREATININE 0.87 03/10/2020   CREATININE 1.00 08/09/2017   HIV Lab Results  Component Value Date   HIV NON-REACTIVE 08/28/2021   HIV NON REACTIVE 05/10/2021   HIV NON-REACTIVE 09/25/2020   HIV NON REACTIVE 07/05/2020   HIV Non Reactive 03/10/2020   Hepatitis B Lab Results  Component Value Date   HEPBSAB REACTIVE (A) 09/25/2020   HEPBSAG NON-REACTIVE 09/25/2020   Hepatitis C No results found for: "HEPCAB", "HCVRNAPCRQN" Hepatitis A No results found for: "HAV" RPR and STI Lab Results  Component Value Date   LABRPR NON-REACTIVE 08/28/2021   LABRPR NON REACTIVE 05/10/2021   LABRPR NON-REACTIVE 09/25/2020   LABRPR NON REACTIVE 03/10/2020   LABRPR NON REACTIVE 05/25/2019    STI Results GC CT  08/28/2021 11:53 AM Negative    Negative  Negative  Negative    Negative    Negative   09/25/2020  2:44 PM Negative    Negative    Negative  Negative    Negative    Negative   07/05/2020  9:04 PM Negative  Negative   12/23/2019  2:07 PM Negative  Negative   05/25/2019 12:00 AM Negative  Negative   05/30/2017 12:00 AM Negative  Negative   03/22/2015 12:00 AM Negative  Negative     Assessment: Mark Boone is here today for his 3 month PrEP follow up. He is taking Descovy without any issues or problems. A few new partners since last visit but uses condoms 100% of the time. No problems with insurance or getting it at [facility]. He has 4 pills left of his current fill. Will screen today and send in 3 more months of Descovy. Will see him back in 3 months.   Of note, he is no longer interested in Apretude and states  that he is not having any issues with weight gain anymore.  Plan: - HIV antibody, RPR, urine/rectal/pharyngeal GC/CT swabs for cytology today - Descovy x 3 months if HIV negative - F/up in 3 months  Mark Boone, PharmD, BCIDP, AAHIVP, CPP Clinical Pharmacist Practitioner Infectious Diseases Clinical Pharmacist [facility] 11/14/2021, 2:05 PM

## 2021-11-15 LAB — CYTOLOGY, (ORAL, ANAL, URETHRAL) ANCILLARY ONLY
Chlamydia: NEGATIVE
Chlamydia: NEGATIVE
Comment: NEGATIVE
Comment: NEGATIVE
Comment: NORMAL
Comment: NORMAL
Neisseria Gonorrhea: NEGATIVE
Neisseria Gonorrhea: NEGATIVE

## 2021-11-15 LAB — URINE CYTOLOGY ANCILLARY ONLY
Chlamydia: NEGATIVE
Comment: NEGATIVE
Comment: NORMAL
Neisseria Gonorrhea: NEGATIVE

## 2021-11-15 LAB — RPR: RPR Ser Ql: NONREACTIVE

## 2021-11-15 LAB — HIV ANTIBODY (ROUTINE TESTING W REFLEX): HIV 1&2 Ab, 4th Generation: NONREACTIVE

## 2021-11-22 ENCOUNTER — Ambulatory Visit: Payer: Self-pay | Admitting: Pharmacist

## 2021-12-05 ENCOUNTER — Other Ambulatory Visit (HOSPITAL_COMMUNITY): Payer: Self-pay

## 2021-12-07 ENCOUNTER — Other Ambulatory Visit (HOSPITAL_COMMUNITY): Payer: Self-pay

## 2021-12-27 ENCOUNTER — Other Ambulatory Visit (HOSPITAL_COMMUNITY): Payer: Self-pay

## 2021-12-31 ENCOUNTER — Other Ambulatory Visit (HOSPITAL_COMMUNITY): Payer: Self-pay

## 2022-01-18 ENCOUNTER — Other Ambulatory Visit (HOSPITAL_COMMUNITY): Payer: Self-pay

## 2022-01-23 ENCOUNTER — Other Ambulatory Visit (HOSPITAL_COMMUNITY): Payer: Self-pay

## 2022-01-23 ENCOUNTER — Other Ambulatory Visit: Payer: Self-pay | Admitting: Pharmacist

## 2022-01-23 DIAGNOSIS — Z79899 Other long term (current) drug therapy: Secondary | ICD-10-CM

## 2022-01-24 ENCOUNTER — Other Ambulatory Visit (HOSPITAL_COMMUNITY): Payer: Self-pay

## 2022-01-31 ENCOUNTER — Other Ambulatory Visit: Payer: Self-pay

## 2022-01-31 ENCOUNTER — Other Ambulatory Visit (HOSPITAL_COMMUNITY): Payer: Self-pay

## 2022-01-31 DIAGNOSIS — Z79899 Other long term (current) drug therapy: Secondary | ICD-10-CM

## 2022-01-31 MED ORDER — EMTRICITABINE-TENOFOVIR AF 200-25 MG PO TABS
1.0000 | ORAL_TABLET | Freq: Every day | ORAL | 0 refills | Status: DC
  Filled 2022-01-31: qty 30, 30d supply, fill #0

## 2022-02-01 ENCOUNTER — Other Ambulatory Visit (HOSPITAL_COMMUNITY): Payer: Self-pay

## 2022-02-02 ENCOUNTER — Other Ambulatory Visit (HOSPITAL_COMMUNITY): Payer: Self-pay

## 2022-02-04 ENCOUNTER — Other Ambulatory Visit (HOSPITAL_COMMUNITY): Payer: Self-pay

## 2022-02-04 NOTE — Progress Notes (Incomplete)
Date:  02/04/2022   HPI: Mark Boone is a 31 y.o. male who presents to the [facility] pharmacy clinic for HIV PrEP follow-up.  Insured   [x]    Uninsured  []    Patient Active Problem List   Diagnosis Date Noted   High risk sexual behavior 09/25/2020   On pre-exposure prophylaxis for HIV 09/25/2020    Patient's Medications  New Prescriptions   No medications on file  Previous Medications   AZITHROMYCIN (ZITHROMAX) 250 MG TABLET    Take 1 tablet (250 mg total) by mouth daily. Take first 2 tablets together, then 1 every day until finished.   EMTRICITABINE-TENOFOVIR AF (DESCOVY) 200-25 MG TABLET    Take 1 tablet by mouth daily.   HYDROCORTISONE (ANUSOL-HC) 2.5 % RECTAL CREAM    Place 1 application rectally 2 (two) times daily.  Modified Medications   No medications on file  Discontinued Medications   No medications on file    Allergies: No Known Allergies  Past Medical History: No past medical history on file.  Social History: Social History   Socioeconomic History   Marital status: Single    Spouse name: Not on file   Number of children: Not on file   Years of education: Not on file   Highest education level: Not on file  Occupational History   Not on file  Tobacco Use   Smoking status: Former    Types: Cigarettes   Smokeless tobacco: Never   Tobacco comments:    3 cigarettes/day  Vaping Use   Vaping Use: Some days  Substance and Sexual Activity   Alcohol use: Yes    Comment: Socially    Drug use: No   Sexual activity: Yes  Other Topics Concern   Not on file  Social History Narrative   Not on file   Social Determinants of Health   Financial Resource Strain: Not on file  Food Insecurity: Not on file  Transportation Needs: Not on file  Physical Activity: Not on file  Stress: Not on file  Social Connections: Not on file       08/28/2021   11:51 AM  [facility] HIV PREP FLOWSHEET RESULTS  Insurance Status Uninsured  Gender at birth Male  Gender identity  cis-Male  Risk for HIV Hx of nPEP use  Sex Partners Men only  # sex partners past 3-6 mos 1  Sex activity preferences Insertive and receptive;Oral  Condom use Yes  % condom use 100  Treated for STI? No  HIV symptoms? None    Labs:  SCr: Lab Results  Component Value Date   CREATININE 0.91 01/15/2021   CREATININE 0.91 09/25/2020   CREATININE 1.11 05/17/2020   CREATININE 0.87 03/10/2020   CREATININE 1.00 08/09/2017   HIV Lab Results  Component Value Date   HIV NON-REACTIVE 11/14/2021   HIV NON-REACTIVE 08/28/2021   HIV NON REACTIVE 05/10/2021   HIV NON-REACTIVE 09/25/2020   HIV NON REACTIVE 07/05/2020   Hepatitis B Lab Results  Component Value Date   HEPBSAB REACTIVE (A) 09/25/2020   HEPBSAG NON-REACTIVE 09/25/2020   Hepatitis C No results found for: "HEPCAB", "HCVRNAPCRQN" Hepatitis A No results found for: "HAV" RPR and STI Lab Results  Component Value Date   LABRPR NON-REACTIVE 11/14/2021   LABRPR NON-REACTIVE 08/28/2021   LABRPR NON REACTIVE 05/10/2021   LABRPR NON-REACTIVE 09/25/2020   LABRPR NON REACTIVE 03/10/2020    STI Results GC CT  11/14/2021  2:13 PM Negative    Negative  Negative  Negative    Negative    Negative   08/28/2021 11:53 AM Negative    Negative    Negative  Negative    Negative    Negative   09/25/2020  2:44 PM Negative    Negative    Negative  Negative    Negative    Negative   07/05/2020  9:04 PM Negative  Negative   12/23/2019  2:07 PM Negative  Negative   05/25/2019 12:00 AM Negative  Negative   05/30/2017 12:00 AM Negative  Negative   03/22/2015 12:00 AM Negative  Negative     Assessment: Mark Boone is here today for his 3 month PrEP follow up. He is taking Descovy and reports _____.   He is eligible for the Hep A (if MSM), flu, COVID, Mpox (if MSM),  . After discussing the risks and benefits, he _____.   Plan: - Collect HIV Ab, CBC, BMP, lipid panel, and Hep C Ab - Administer ______ - Follow up on  _____ with Cassie  Jacolyn Reedy, Pharm-D [facility]

## 2022-02-05 ENCOUNTER — Ambulatory Visit: Payer: BC Managed Care – PPO | Admitting: Pharmacist

## 2022-02-18 ENCOUNTER — Ambulatory Visit: Payer: BC Managed Care – PPO | Admitting: Pharmacist

## 2022-02-19 ENCOUNTER — Other Ambulatory Visit (HOSPITAL_COMMUNITY): Payer: Self-pay

## 2022-02-22 ENCOUNTER — Other Ambulatory Visit (HOSPITAL_COMMUNITY): Payer: Self-pay

## 2022-02-28 ENCOUNTER — Other Ambulatory Visit: Payer: Self-pay

## 2022-02-28 ENCOUNTER — Ambulatory Visit (INDEPENDENT_AMBULATORY_CARE_PROVIDER_SITE_OTHER): Payer: BC Managed Care – PPO | Admitting: Pharmacist

## 2022-02-28 ENCOUNTER — Other Ambulatory Visit (HOSPITAL_COMMUNITY): Payer: Self-pay

## 2022-02-28 ENCOUNTER — Other Ambulatory Visit (HOSPITAL_COMMUNITY)
Admission: RE | Admit: 2022-02-28 | Discharge: 2022-02-28 | Disposition: A | Discharge status: Home / Self Care | Payer: BC Managed Care – PPO | Source: Ambulatory Visit | Attending: Family | Admitting: Family

## 2022-02-28 DIAGNOSIS — Z113 Encounter for screening for infections with a predominantly sexual mode of transmission: Secondary | ICD-10-CM

## 2022-02-28 DIAGNOSIS — Z79899 Other long term (current) drug therapy: Secondary | ICD-10-CM | POA: Diagnosis not present

## 2022-02-28 MED ORDER — EMTRICITABINE-TENOFOVIR AF 200-25 MG PO TABS
1.0000 | ORAL_TABLET | Freq: Every day | ORAL | 2 refills | Status: DC
  Filled 2022-02-28: qty 30, 30d supply, fill #0
  Filled 2022-03-25: qty 30, 30d supply, fill #1
  Filled 2022-04-24: qty 30, 30d supply, fill #2

## 2022-02-28 NOTE — Progress Notes (Addendum)
Date:  02/28/2022   HPI: Mark Boone is a 31 y.o. male who presents to the [facility] pharmacy clinic for HIV PrEP follow-up.  Insured   [x]    Uninsured  []    Patient Active Problem List   Diagnosis Date Noted   High risk sexual behavior 09/25/2020   On pre-exposure prophylaxis for HIV 09/25/2020    Patient's Medications  New Prescriptions   No medications on file  Previous Medications   AZITHROMYCIN (ZITHROMAX) 250 MG TABLET    Take 1 tablet (250 mg total) by mouth daily. Take first 2 tablets together, then 1 every day until finished.   HYDROCORTISONE (ANUSOL-HC) 2.5 % RECTAL CREAM    Place 1 application rectally 2 (two) times daily.  Modified Medications   Modified Medication Previous Medication   EMTRICITABINE-TENOFOVIR AF (DESCOVY) 200-25 MG TABLET emtricitabine-tenofovir AF (DESCOVY) 200-25 MG tablet      Take 1 tablet by mouth daily.    Take 1 tablet by mouth daily.  Discontinued Medications   No medications on file    Allergies: No Known Allergies  Past Medical History: No past medical history on file.  Social History: Social History   Socioeconomic History   Marital status: Single    Spouse name: Not on file   Number of children: Not on file   Years of education: Not on file   Highest education level: Not on file  Occupational History   Not on file  Tobacco Use   Smoking status: Former    Types: Cigarettes   Smokeless tobacco: Never   Tobacco comments:    3 cigarettes/day  Vaping Use   Vaping Use: Some days  Substance and Sexual Activity   Alcohol use: Yes    Comment: Socially    Drug use: No   Sexual activity: Yes  Other Topics Concern   Not on file  Social History Narrative   Not on file   Social Determinants of Health   Financial Resource Strain: Not on file  Food Insecurity: Not on file  Transportation Needs: Not on file  Physical Activity: Not on file  Stress: Not on file  Social Connections: Not on file       08/28/2021    11:51 AM  [facility] HIV PREP FLOWSHEET RESULTS  Insurance Status Uninsured  Gender at birth Male  Gender identity cis-Male  Risk for HIV Hx of nPEP use  Sex Partners Men only  # sex partners past 3-6 mos 1  Sex activity preferences Insertive and receptive;Oral  Condom use Yes  % condom use 100  Treated for STI? No  HIV symptoms? None    Labs:  SCr: Lab Results  Component Value Date   CREATININE 0.91 01/15/2021   CREATININE 0.91 09/25/2020   CREATININE 1.11 05/17/2020   CREATININE 0.87 03/10/2020   CREATININE 1.00 08/09/2017   HIV Lab Results  Component Value Date   HIV NON-REACTIVE 11/14/2021   HIV NON-REACTIVE 08/28/2021   HIV NON REACTIVE 05/10/2021   HIV NON-REACTIVE 09/25/2020   HIV NON REACTIVE 07/05/2020   Hepatitis B Lab Results  Component Value Date   HEPBSAB REACTIVE (A) 09/25/2020   HEPBSAG NON-REACTIVE 09/25/2020   Hepatitis C No results found for: "HEPCAB", "HCVRNAPCRQN" Hepatitis A No results found for: "HAV" RPR and STI Lab Results  Component Value Date   LABRPR NON-REACTIVE 11/14/2021   LABRPR NON-REACTIVE 08/28/2021   LABRPR NON REACTIVE 05/10/2021   LABRPR NON-REACTIVE 09/25/2020   LABRPR NON REACTIVE 03/10/2020  STI Results GC CT  11/14/2021  2:13 PM Negative    Negative    Negative  Negative    Negative    Negative   08/28/2021 11:53 AM Negative    Negative    Negative  Negative    Negative    Negative   09/25/2020  2:44 PM Negative    Negative    Negative  Negative    Negative    Negative   07/05/2020  9:04 PM Negative  Negative   12/23/2019  2:07 PM Negative  Negative   05/25/2019 12:00 AM Negative  Negative   05/30/2017 12:00 AM Negative  Negative   03/22/2015 12:00 AM Negative  Negative     Assessment: Mark Boone presents to clinic today for PrEP follow-up. He has no adherence or adverse event issues. States he only has 2 tablets left, so sent new prescription in today. Provided him with Descovy samples as he will not get  his medication in the mail until Monday. Last STI screening was 11/14/21 and was negative. Screened patient for acute HIV symptoms such as fatigue, muscle aches, rash, sore throat, lymphadenopathy, headache, night sweats, nausea/vomiting/diarrhea, and fever. States he has been with a few new sexual partners since last visit; will check RPR and urine/oral/rectal cytologies today. Will check HIV antibody. Also due for annual BMP and lipid panel which were collected.  Patient states he has still experienced some weight gain on Descovy but would prefer not to change regimens as it works well for him otherwise.   He politely declines the flu shot today.   Plan: Check HIV antibody, lipid panel, BMP, RPR, and urine/oral/rectal cytologies Refill Descovy x 3 months Follow-up with Cassie on 05/23/22  Margarite Gouge, PharmD, CPP, BCIDP, AAHIVP Clinical Pharmacist Practitioner Infectious Diseases Clinical Pharmacist [facility] 02/28/2022, 3:44 PM

## 2022-03-01 ENCOUNTER — Other Ambulatory Visit: Payer: Self-pay

## 2022-03-01 LAB — CYTOLOGY, (ORAL, ANAL, URETHRAL) ANCILLARY ONLY
Chlamydia: NEGATIVE
Chlamydia: NEGATIVE
Comment: NEGATIVE
Comment: NEGATIVE
Comment: NORMAL
Comment: NORMAL
Neisseria Gonorrhea: NEGATIVE
Neisseria Gonorrhea: NEGATIVE

## 2022-03-01 LAB — BASIC METABOLIC PANEL
BUN: 9 mg/dL (ref 7–25)
CO2: 28 mmol/L (ref 20–32)
Calcium: 9.5 mg/dL (ref 8.6–10.3)
Chloride: 107 mmol/L (ref 98–110)
Creat: 0.95 mg/dL (ref 0.60–1.26)
Glucose, Bld: 88 mg/dL (ref 65–99)
Potassium: 4.5 mmol/L (ref 3.5–5.3)
Sodium: 142 mmol/L (ref 135–146)

## 2022-03-01 LAB — LIPID PANEL
Cholesterol: 140 mg/dL (ref <200)
HDL: 47 mg/dL (ref >=40)
LDL Cholesterol (Calc): 71 mg/dL (calc)
Non-HDL Cholesterol (Calc): 93 mg/dL (calc) (ref <130)
Total CHOL/HDL Ratio: 3 (calc) (ref <5.0)
Triglycerides: 136 mg/dL (ref <150)

## 2022-03-01 LAB — HIV ANTIBODY (ROUTINE TESTING W REFLEX): HIV 1&2 Ab, 4th Generation: NONREACTIVE

## 2022-03-01 LAB — URINE CYTOLOGY ANCILLARY ONLY
Chlamydia: NEGATIVE
Comment: NEGATIVE
Comment: NORMAL
Neisseria Gonorrhea: NEGATIVE

## 2022-03-01 LAB — RPR: RPR Ser Ql: NONREACTIVE

## 2022-03-06 ENCOUNTER — Other Ambulatory Visit: Payer: Self-pay | Admitting: Pharmacist

## 2022-03-06 DIAGNOSIS — Z79899 Other long term (current) drug therapy: Secondary | ICD-10-CM

## 2022-03-06 MED ORDER — DESCOVY 200-25 MG PO TABS: 1.0000 | ORAL_TABLET | Freq: Every day | ORAL | 0 refills | Status: AC

## 2022-03-06 NOTE — Progress Notes (Signed)
Medication Samples have been provided to the patient.  Drug name: Descovy        Strength: 200/25 mg       Qty: 7 tablets (1 pack)   LOT: [id number]   Exp.Date: 05/2023  Dosing instructions: Take one tablet by mouth once daily  The patient has been instructed regarding the correct time, dose, and frequency of taking this medication, including desired effects and most common side effects.   Herchel Hopkin L. Jannette Fogo, PharmD, BCIDP, AAHIVP, CPP Clinical Pharmacist Practitioner Infectious Diseases Clinical Pharmacist [facility] 02/28/2020, 10:07 AM

## 2022-03-25 ENCOUNTER — Other Ambulatory Visit (HOSPITAL_COMMUNITY): Payer: Self-pay

## 2022-03-26 ENCOUNTER — Other Ambulatory Visit: Payer: Self-pay

## 2022-03-27 ENCOUNTER — Other Ambulatory Visit (HOSPITAL_COMMUNITY): Payer: Self-pay

## 2022-03-27 ENCOUNTER — Other Ambulatory Visit: Payer: Self-pay

## 2022-04-17 ENCOUNTER — Other Ambulatory Visit (HOSPITAL_COMMUNITY): Payer: Self-pay

## 2022-04-19 ENCOUNTER — Other Ambulatory Visit (HOSPITAL_COMMUNITY): Payer: Self-pay

## 2022-04-22 ENCOUNTER — Other Ambulatory Visit (HOSPITAL_COMMUNITY): Payer: Self-pay

## 2022-04-24 ENCOUNTER — Other Ambulatory Visit (HOSPITAL_COMMUNITY): Payer: Self-pay

## 2022-04-24 ENCOUNTER — Other Ambulatory Visit: Payer: Self-pay

## 2022-05-15 ENCOUNTER — Other Ambulatory Visit (HOSPITAL_COMMUNITY): Payer: Self-pay

## 2022-05-16 ENCOUNTER — Other Ambulatory Visit (HOSPITAL_COMMUNITY): Payer: Self-pay

## 2022-05-16 ENCOUNTER — Other Ambulatory Visit: Payer: Self-pay | Admitting: Pharmacist

## 2022-05-16 DIAGNOSIS — Z79899 Other long term (current) drug therapy: Secondary | ICD-10-CM

## 2022-05-20 ENCOUNTER — Ambulatory Visit: Payer: BC Managed Care – PPO | Admitting: Pharmacist

## 2022-05-22 ENCOUNTER — Ambulatory Visit (INDEPENDENT_AMBULATORY_CARE_PROVIDER_SITE_OTHER): Payer: BC Managed Care – PPO | Admitting: Pharmacist

## 2022-05-22 ENCOUNTER — Other Ambulatory Visit: Payer: Self-pay

## 2022-05-22 ENCOUNTER — Other Ambulatory Visit (HOSPITAL_COMMUNITY): Payer: Self-pay

## 2022-05-22 ENCOUNTER — Other Ambulatory Visit (HOSPITAL_COMMUNITY)
Admission: RE | Admit: 2022-05-22 | Discharge: 2022-05-22 | Disposition: A | Discharge status: Home / Self Care | Payer: BC Managed Care – PPO | Source: Ambulatory Visit | Attending: Family | Admitting: Family

## 2022-05-22 ENCOUNTER — Telehealth: Payer: Self-pay | Admitting: Pharmacist

## 2022-05-22 DIAGNOSIS — Z2981 Encounter for HIV pre-exposure prophylaxis: Secondary | ICD-10-CM

## 2022-05-22 DIAGNOSIS — Z113 Encounter for screening for infections with a predominantly sexual mode of transmission: Secondary | ICD-10-CM | POA: Insufficient documentation

## 2022-05-22 DIAGNOSIS — Z79899 Other long term (current) drug therapy: Secondary | ICD-10-CM

## 2022-05-22 MED ORDER — DESCOVY 200-25 MG PO TABS: 1.0000 | ORAL_TABLET | Freq: Every day | ORAL | 0 refills | Status: AC

## 2022-05-22 NOTE — Progress Notes (Signed)
Date:  05/22/2022   HPI: Mark Boone is a 32 y.o. male who presents to the [place] clinic for HIV PrEP follow-up.  Insured   '[x]'$    Uninsured  '[]'$    Patient Active Problem List   Diagnosis Date Noted   High risk sexual behavior 09/25/2020   On pre-exposure prophylaxis for HIV 09/25/2020    Patient's Medications  New Prescriptions   No medications on file  Previous Medications   AZITHROMYCIN (ZITHROMAX) 250 MG TABLET    Take 1 tablet (250 mg total) by mouth daily. Take first 2 tablets together, then 1 every day until finished.   EMTRICITABINE-TENOFOVIR AF (DESCOVY) 200-25 MG TABLET    Take 1 tablet by mouth daily.   HYDROCORTISONE (ANUSOL-HC) 2.5 % RECTAL CREAM    Place 1 application rectally 2 (two) times daily.  Modified Medications   No medications on file  Discontinued Medications   No medications on file    Allergies: No Known Allergies  Past Medical History: No past medical history on file.  Social History: Social History   Socioeconomic History   Marital status: Single    Spouse name: Not on file   Number of children: Not on file   Years of education: Not on file   Highest education level: Not on file  Occupational History   Not on file  Tobacco Use   Smoking status: Former    Types: Cigarettes   Smokeless tobacco: Never   Tobacco comments:    3 cigarettes/day  Vaping Use   Vaping Use: Some days  Substance and Sexual Activity   Alcohol use: Yes    Comment: Socially    Drug use: No   Sexual activity: Yes  Other Topics Concern   Not on file  Social History Narrative   Not on file   Social Determinants of Health   Financial Resource Strain: Not on file  Food Insecurity: Not on file  Transportation Needs: Not on file  Physical Activity: Not on file  Stress: Not on file  Social Connections: Not on file       08/28/2021   11:51 AM  [facility] HIV PREP FLOWSHEET RESULTS  Insurance Status Uninsured  Gender at birth Male  Gender identity  cis-Male  Risk for HIV Hx of nPEP use  Sex Partners Men only  # sex partners past 3-6 mos 1  Sex activity preferences Insertive and receptive;Oral  Condom use Yes  % condom use 100  Treated for STI? No  HIV symptoms? None    Labs:  SCr: Lab Results  Component Value Date   CREATININE 0.95 02/28/2022   CREATININE 0.91 01/15/2021   CREATININE 0.91 09/25/2020   CREATININE 1.11 05/17/2020   CREATININE 0.87 03/10/2020   HIV Lab Results  Component Value Date   HIV NON-REACTIVE 02/28/2022   HIV NON-REACTIVE 11/14/2021   HIV NON-REACTIVE 08/28/2021   HIV NON REACTIVE 05/10/2021   HIV NON-REACTIVE 09/25/2020   Hepatitis B Lab Results  Component Value Date   HEPBSAB REACTIVE (A) 09/25/2020   HEPBSAG NON-REACTIVE 09/25/2020   Hepatitis C No results found for: "HEPCAB", "HCVRNAPCRQN" Hepatitis A No results found for: "HAV" RPR and STI Lab Results  Component Value Date   LABRPR NON-REACTIVE 02/28/2022   LABRPR NON-REACTIVE 11/14/2021   LABRPR NON-REACTIVE 08/28/2021   LABRPR NON REACTIVE 05/10/2021   LABRPR NON-REACTIVE 09/25/2020    STI Results GC CT  02/28/2022  3:29 PM Negative    Negative    Negative  Negative    Negative    Negative   11/14/2021  2:13 PM Negative    Negative    Negative  Negative    Negative    Negative   08/28/2021 11:53 AM Negative    Negative    Negative  Negative    Negative    Negative   09/25/2020  2:44 PM Negative    Negative    Negative  Negative    Negative    Negative   07/05/2020  9:04 PM Negative  Negative   12/23/2019  2:07 PM Negative  Negative   05/25/2019 12:00 AM Negative  Negative   05/30/2017 12:00 AM Negative  Negative   03/22/2015 12:00 AM Negative  Negative     Assessment: Mark Boone presents to clinic today for PrEP follow-up. He has no adherence or adverse event issues. States he took his last dose last night, so sent in a new prescription today. Provided him with Descovy samples as the medication will likely  not arrive until tomorrow and he will need a dose today. Screened patient for acute HIV symptoms such as fatigue, muscle aches, rash, sore throat, lymphadenopathy, headache, night sweats, nausea/vomiting/diarrhea, and fever. Patient has had new partners since last visit and wished to receive 3-site STI testing today as well as RPR. Will check HIV antibody.    He politely declines the flu shot today.   Plan: Check HIV antibody, RPR, and urine/oral/rectal cytologies Refill Descovy x 3 months Follow-up with Cassie on 08/22/22   Billey Gosling, PharmD PGY1 Pharmacy Resident 3/6/202411:47 AM

## 2022-05-22 NOTE — Telephone Encounter (Signed)
Medication Samples have been provided to the patient.  Drug name: Descovy     Strength: 200/25 mg    Qty: 7 tablets (1 bottles) LOT: [id number]   Exp.Date: 3/25  Dosing instructions: Take one tablet by mouth once daily.   The patient has been instructed regarding the correct time, dose, and frequency of taking this medication, including desired effects and most common side effects.   Alfonse Spruce, PharmD, CPP, BCIDP, [place] Clinical Pharmacist Practitioner [facility]

## 2022-05-23 ENCOUNTER — Other Ambulatory Visit: Payer: Self-pay | Admitting: Pharmacist

## 2022-05-23 ENCOUNTER — Other Ambulatory Visit (HOSPITAL_COMMUNITY): Payer: Self-pay

## 2022-05-23 ENCOUNTER — Other Ambulatory Visit: Payer: Self-pay

## 2022-05-23 ENCOUNTER — Ambulatory Visit: Payer: BC Managed Care – PPO | Admitting: Pharmacist

## 2022-05-23 DIAGNOSIS — Z79899 Other long term (current) drug therapy: Secondary | ICD-10-CM

## 2022-05-23 LAB — HIV ANTIBODY (ROUTINE TESTING W REFLEX): HIV 1&2 Ab, 4th Generation: NONREACTIVE

## 2022-05-23 LAB — URINE CYTOLOGY ANCILLARY ONLY
Chlamydia: NEGATIVE
Comment: NEGATIVE
Comment: NORMAL
Neisseria Gonorrhea: NEGATIVE

## 2022-05-23 LAB — CYTOLOGY, (ORAL, ANAL, URETHRAL) ANCILLARY ONLY
Chlamydia: NEGATIVE
Chlamydia: NEGATIVE
Comment: NEGATIVE
Comment: NEGATIVE
Comment: NORMAL
Comment: NORMAL
Neisseria Gonorrhea: NEGATIVE
Neisseria Gonorrhea: NEGATIVE

## 2022-05-23 LAB — RPR: RPR Ser Ql: NONREACTIVE

## 2022-05-23 MED ORDER — DESCOVY 200-25 MG PO TABS
1.0000 | ORAL_TABLET | Freq: Every day | ORAL | 2 refills | Status: DC
  Filled 2022-05-23: qty 30, 30d supply, fill #0
  Filled 2022-06-14: qty 30, 30d supply, fill #1
  Filled 2022-07-17: qty 30, 30d supply, fill #2

## 2022-05-24 ENCOUNTER — Other Ambulatory Visit (HOSPITAL_COMMUNITY): Payer: Self-pay

## 2022-05-28 ENCOUNTER — Encounter (HOSPITAL_COMMUNITY): Payer: Self-pay

## 2022-05-28 ENCOUNTER — Emergency Department (HOSPITAL_COMMUNITY): Payer: BC Managed Care – PPO

## 2022-05-28 ENCOUNTER — Other Ambulatory Visit: Payer: Self-pay

## 2022-05-28 ENCOUNTER — Emergency Department (HOSPITAL_COMMUNITY)
Admission: EM | Admit: 2022-05-28 | Discharge: 2022-05-28 | Disposition: A | Discharge status: Home / Self Care | Payer: BC Managed Care – PPO | Attending: Emergency Medicine | Admitting: Emergency Medicine

## 2022-05-28 DIAGNOSIS — K6289 Other specified diseases of anus and rectum: Secondary | ICD-10-CM | POA: Insufficient documentation

## 2022-05-28 DIAGNOSIS — K625 Hemorrhage of anus and rectum: Secondary | ICD-10-CM | POA: Diagnosis present

## 2022-05-28 LAB — CBC WITH DIFFERENTIAL/PLATELET
Abs Immature Granulocytes: 0 10*3/uL (ref 0.00–0.07)
Basophils Absolute: 0 10*3/uL (ref 0.0–0.1)
Basophils Relative: 1 %
Eosinophils Absolute: 0.1 10*3/uL (ref 0.0–0.5)
Eosinophils Relative: 3 %
HCT: 43 % (ref 39.0–52.0)
Hemoglobin: 14.1 g/dL (ref 13.0–17.0)
Immature Granulocytes: 0 %
Lymphocytes Relative: 38 %
Lymphs Abs: 1.6 10*3/uL (ref 0.7–4.0)
MCH: 32.5 pg (ref 26.0–34.0)
MCHC: 32.8 g/dL (ref 30.0–36.0)
MCV: 99.1 fL (ref 80.0–100.0)
Monocytes Absolute: 0.4 10*3/uL (ref 0.1–1.0)
Monocytes Relative: 10 %
Neutro Abs: 2.1 10*3/uL (ref 1.7–7.7)
Neutrophils Relative %: 48 %
Platelets: 160 10*3/uL (ref 150–400)
RBC: 4.34 MIL/uL (ref 4.22–5.81)
RDW: 12 % (ref 11.5–15.5)
WBC: 4.3 10*3/uL (ref 4.0–10.5)
nRBC: 0 % (ref 0.0–0.2)

## 2022-05-28 LAB — BASIC METABOLIC PANEL
Anion gap: 3 — ABNORMAL LOW (ref 5–15)
BUN: 12 mg/dL (ref 6–20)
CO2: 25 mmol/L (ref 22–32)
Calcium: 8.6 mg/dL — ABNORMAL LOW (ref 8.9–10.3)
Chloride: 109 mmol/L (ref 98–111)
Creatinine, Ser: 0.76 mg/dL (ref 0.61–1.24)
GFR, Estimated: >60 mL/min (ref >60)
Glucose, Bld: 112 mg/dL — ABNORMAL HIGH (ref 70–99)
Potassium: 3.8 mmol/L (ref 3.5–5.1)
Sodium: 137 mmol/L (ref 135–145)

## 2022-05-28 MED ORDER — STERILE WATER FOR INJECTION IJ SOLN
INTRAMUSCULAR | Status: AC
  Filled 2022-05-28: qty 10

## 2022-05-28 MED ORDER — SODIUM CHLORIDE (PF) 0.9 % IJ SOLN
INTRAMUSCULAR | Status: AC
  Filled 2022-05-28: qty 50

## 2022-05-28 MED ORDER — IOHEXOL 300 MG/ML  SOLN
100.0000 mL | Freq: Once | INTRAMUSCULAR | Status: AC | PRN
  Administered 2022-05-28: 100 mL via INTRAVENOUS

## 2022-05-28 MED ORDER — CEFTRIAXONE SODIUM 1 G IJ SOLR
500.0000 mg | Freq: Once | INTRAMUSCULAR | Status: AC
  Administered 2022-05-28: 500 mg via INTRAMUSCULAR
  Filled 2022-05-28: qty 10

## 2022-05-28 MED ORDER — DOXYCYCLINE HYCLATE 100 MG PO CAPS: 100.0000 mg | ORAL_CAPSULE | Freq: Two times a day (BID) | ORAL | 0 refills | Status: DC

## 2022-05-28 MED ORDER — DOCUSATE SODIUM 100 MG PO CAPS: 100.0000 mg | ORAL_CAPSULE | Freq: Two times a day (BID) | ORAL | 0 refills | Status: DC

## 2022-05-28 NOTE — ED Provider Notes (Signed)
[place] EMERGENCY DEPARTMENT AT [facility] Provider Note   CSN: [id number] Arrival date & time: 05/28/22  0008     History  Chief Complaint  Patient presents with   Rectal Bleeding    Mark Boone is a 32 y.o. male.  The history is provided by the patient.  Patient reports for the past week he has had anal pain and pressure.  He reports frequent small soft stools.  He reports he is having blood mixed in his stool as well.  No melena.  No fevers or vomiting.  No abdominal pain.  Patient does undergo anal intercourse.  He is concerned he may have an infection. No recent rectal trauma is reported  He does not take any anticoagulation. He reports he was recently evaluated for rectal STIs and they were negative     Home Medications Prior to Admission medications   Medication Sig Start Date End Date Taking? Authorizing Provider  docusate sodium (COLACE) 100 MG capsule Take 1 capsule (100 mg total) by mouth every 12 (twelve) hours. 05/28/22  Yes Ripley Fraise, MD  doxycycline (VIBRAMYCIN) 100 MG capsule Take 1 capsule (100 mg total) by mouth 2 (two) times daily. One po bid x 7 days 05/28/22  Yes Ripley Fraise, MD  azithromycin (ZITHROMAX) 250 MG tablet Take 1 tablet (250 mg total) by mouth daily. Take first 2 tablets together, then 1 every day until finished. 01/15/21   Hayden Rasmussen, MD  emtricitabine-tenofovir AF (DESCOVY) 200-25 MG tablet Take 1 tablet by mouth daily for 7 days. 05/22/22 05/29/22  Esmond Plants, RPH-CPP  emtricitabine-tenofovir AF (DESCOVY) 200-25 MG tablet Take 1 tablet by mouth daily. 05/23/22   Kuppelweiser, Cassie L, RPH-CPP  hydrocortisone (ANUSOL-HC) 2.5 % rectal cream Place 1 application rectally 2 (two) times daily. 11/21/20   Sponseller, Gypsy Balsam, PA-C      Allergies    Patient has no known allergies.    Review of Systems   Review of Systems  Constitutional:  Negative for fever.  Gastrointestinal:  Positive for rectal pain.     Physical Exam Updated Vital Signs BP (!) 156/94   Pulse 85   Temp 98.5 F (36.9 C) (Oral)   Resp 17   Ht 1.829 m (6')   Wt 95.3 kg   SpO2 100%   BMI 28.48 kg/m  Physical Exam CONSTITUTIONAL: Well developed/well nourished, anxious HEAD: Normocephalic/atraumatic EYES: EOMI/PERRL ENMT: Mucous membranes moist NECK: supple no meningeal signs CV: S1/S2 noted, no murmurs/rubs/gallops noted LUNGS: Lungs are clear to auscultation bilaterally, no apparent distress ABDOMEN: soft, nontender, no rebound or guarding, bowel sounds noted throughout abdomen Rectal-chaperoned by nurse Shayla No external hemorrhoids, no blood, no melena.  Brown stool noted.  No obvious abscess.  Minimal tenderness noted on rectal exam. NEURO: Pt is awake/alert/appropriate, moves all extremitiesx4.  No facial droop.   EXTREMITIES: pulses normal/equal, full ROM SKIN: warm, color normal PSYCH: no abnormalities of mood noted, alert and oriented to situation  ED Results / Procedures / Treatments   Labs (all labs ordered are listed, but only abnormal results are displayed) Labs Reviewed  BASIC METABOLIC PANEL - Abnormal; Notable for the following components:      Result Value   Glucose, Bld 112 (*)    Calcium 8.6 (*)    Anion gap 3 (*)    All other components within normal limits  POC OCCULT BLOOD, ED - Normal  CBC WITH DIFFERENTIAL/PLATELET    EKG None  Radiology CT PELVIS W  CONTRAST  Result Date: 05/28/2022 CLINICAL DATA:  Perianal abscess or fistula suspected. Rectal pain and bleeding for 1 week with constipation. EXAM: CT PELVIS WITH CONTRAST TECHNIQUE: Multidetector CT imaging of the pelvis was performed using the standard protocol following the bolus administration of intravenous contrast. RADIATION DOSE REDUCTION: This exam was performed according to the departmental dose-optimization program which includes automated exposure control, adjustment of the mA and/or kV according to patient size and/or  use of iterative reconstruction technique. CONTRAST:  19m OMNIPAQUE IOHEXOL 300 MG/ML  SOLN COMPARISON:  None Available. FINDINGS: Urinary Tract:  No abnormality visualized. Bowel: Question mild wall thickening about the rectum. Visualized bowel loops are otherwise unremarkable. Normal appendix. Vascular/Lymphatic: No pathologically enlarged lymph nodes. No significant vascular abnormality seen. Reproductive:  Unremarkable. Other: Trace free fluid in the pelvis. No free intraperitoneal air. Musculoskeletal: No acute osseous abnormality. No perirectal fluid collection or abscess. IMPRESSION: 1. Question mild wall thickening about the rectum. This may be due to underdistention or mild proctitis. No perirectal abscess. Electronically Signed   By: TPlacido SouM.D.   On: 05/28/2022 03:02    Procedures Procedures    Medications Ordered in ED Medications  sodium chloride (PF) 0.9 % injection (has no administration in time range)  cefTRIAXone (ROCEPHIN) injection 500 mg (has no administration in time range)  iohexol (OMNIPAQUE) 300 MG/ML solution 100 mL (100 mLs Intravenous Contrast Given 05/28/22 0244)    ED Course/ Medical Decision Making/ A&P Clinical Course as of 05/28/22 0321  Tue May 28, 2022  0201 Patient reports recent pain and pressure in his rectum.  He is also been having blood with bowel movements.  No obvious signs of perirectal abscess on exam.  However given his history, will proceed with CT pelvis [DW]  0319 CT scan overall reassuring, though may have mild proctitis.  No signs of abscess.  Patient did have recent negative GC chlamydia testing.  Patient reports he typically does use condoms, but [facility].  Rocephin and doxycycline have been ordered.  I have also advised him to follow-up with gastroenterology if his symptoms continue, will also place on stool softener [DW]    Clinical Course User Index [DW] WRipley Fraise MD                              Medical Decision Making Amount and/or Complexity of Data Reviewed Labs: ordered. Radiology: ordered.  Risk OTC drugs. Prescription drug management.   This patient presents to the ED for concern of rectal pain, this involves an extensive number of treatment options, and is a complaint that carries with it a high risk of complications and morbidity.  The differential diagnosis includes but is not limited to perianal abscess, fistula, proctitis, thrombosed hemorrhoid  Comorbidities that complicate the patient evaluation: Patient's presentation is complicated by their history of anal intercourse  Additional history obtained: Records reviewed  infectious disease notes reviewed  Lab Tests: I Ordered, and personally interpreted labs.  The pertinent results include: Labs reassuring  Imaging Studies ordered: I ordered imaging studies including CT scan pelvis   I independently visualized and interpreted imaging which showed proctitis I agree with the radiologist interpretation   Medicines ordered and prescription drug management: I ordered medication including Rocephin and doxycycline for proctitis  Reevaluation: After the interventions noted above, I reevaluated the patient and found that they have :improved  Complexity of problems addressed: Patient's presentation  is most consistent with  acute presentation with potential threat to life or bodily function  Disposition: After consideration of the diagnostic results and the patient's response to treatment,  I feel that the patent would benefit from discharge   .           Final Clinical Impression(s) / ED Diagnoses Final diagnoses:  Proctitis    Rx / DC Orders ED Discharge Orders          Ordered    doxycycline (VIBRAMYCIN) 100 MG capsule  2 times daily        05/28/22 0316    docusate sodium (COLACE) 100 MG capsule  Every 12 hours        05/28/22 0316              Ripley Fraise, MD 05/28/22  [id number]

## 2022-05-28 NOTE — ED Triage Notes (Signed)
Pt reports rectal bleeding and constipation. States that his LBM was earlier today, but it was very hard and he had to strain. Also reports seeing blood in the toilet after. Endorses constant rectal pain. Denies history of same. Denies abdominal pain. A&Ox4. Ambulatory.

## 2022-06-12 ENCOUNTER — Other Ambulatory Visit (HOSPITAL_COMMUNITY): Payer: Self-pay

## 2022-06-14 ENCOUNTER — Other Ambulatory Visit (HOSPITAL_COMMUNITY): Payer: Self-pay

## 2022-06-17 ENCOUNTER — Other Ambulatory Visit: Payer: Self-pay

## 2022-06-17 ENCOUNTER — Other Ambulatory Visit (HOSPITAL_COMMUNITY): Payer: Self-pay

## 2022-07-09 ENCOUNTER — Other Ambulatory Visit (HOSPITAL_COMMUNITY): Payer: Self-pay

## 2022-07-12 ENCOUNTER — Other Ambulatory Visit (HOSPITAL_COMMUNITY): Payer: Self-pay

## 2022-07-15 ENCOUNTER — Other Ambulatory Visit (HOSPITAL_COMMUNITY): Payer: Self-pay

## 2022-07-17 ENCOUNTER — Other Ambulatory Visit (HOSPITAL_COMMUNITY): Payer: Self-pay

## 2022-08-06 ENCOUNTER — Other Ambulatory Visit (HOSPITAL_COMMUNITY): Payer: Self-pay

## 2022-08-15 ENCOUNTER — Other Ambulatory Visit (HOSPITAL_COMMUNITY): Payer: Self-pay

## 2022-08-16 ENCOUNTER — Other Ambulatory Visit: Payer: Self-pay | Admitting: Pharmacist

## 2022-08-16 ENCOUNTER — Other Ambulatory Visit (HOSPITAL_COMMUNITY): Payer: Self-pay

## 2022-08-16 ENCOUNTER — Encounter (HOSPITAL_COMMUNITY): Payer: Self-pay

## 2022-08-16 ENCOUNTER — Other Ambulatory Visit: Payer: Self-pay

## 2022-08-16 ENCOUNTER — Emergency Department (HOSPITAL_COMMUNITY)
Admission: EM | Admit: 2022-08-16 | Discharge: 2022-08-16 | Disposition: A | Discharge status: Home / Self Care | Payer: BC Managed Care – PPO | Attending: Emergency Medicine | Admitting: Emergency Medicine

## 2022-08-16 DIAGNOSIS — Z760 Encounter for issue of repeat prescription: Secondary | ICD-10-CM | POA: Insufficient documentation

## 2022-08-16 DIAGNOSIS — Z21 Asymptomatic human immunodeficiency virus [HIV] infection status: Secondary | ICD-10-CM | POA: Diagnosis not present

## 2022-08-16 DIAGNOSIS — Z79899 Other long term (current) drug therapy: Secondary | ICD-10-CM

## 2022-08-16 MED ORDER — DESCOVY 200-25 MG PO TABS: 1.0000 | ORAL_TABLET | Freq: Every day | ORAL | 0 refills | Status: DC

## 2022-08-16 MED ORDER — EMTRICITABINE-TENOFOVIR AF 200-25 MG PO TABS
1.0000 | ORAL_TABLET | Freq: Once | ORAL | Status: AC
  Administered 2022-08-16: 1 via ORAL
  Filled 2022-08-16: qty 1

## 2022-08-16 NOTE — Discharge Instructions (Addendum)
I sent in a 30-day supply to your pharmacy.  You received a dose in the emergency department.  Follow-up with infectious disease clinic.

## 2022-08-16 NOTE — ED Triage Notes (Signed)
Pt ran out of decovy. Pt tried to get a refill and doctor was unable to get him in to get the refill. Pt needs refill.

## 2022-08-16 NOTE — ED Provider Notes (Signed)
[place] EMERGENCY DEPARTMENT AT [facility] Provider Note   CSN: [id number] Arrival date & time: 08/16/22  1604     History  Chief Complaint  Patient presents with   Medication Refill    Mark Boone is a 32 y.o. male.  32 year old male presents today for concern of medication refill.  He takes this Cov for HIV.  Reports compliance.  Denies any complaints today.  He states his infectious disease clinic was unable to get him in for refill.  He states he has an appointment scheduled for 6/6.  He states he was instructed to come into the emergency department for the refill.  The history is provided by the patient. No language interpreter was used.       Home Medications Prior to Admission medications   Medication Sig Start Date End Date Taking? Authorizing Provider  emtricitabine-tenofovir AF (DESCOVY) 200-25 MG tablet Take 1 tablet by mouth daily. 08/16/22  Yes Branden Vine, PA-C  azithromycin (ZITHROMAX) 250 MG tablet Take 1 tablet (250 mg total) by mouth daily. Take first 2 tablets together, then 1 every day until finished. 01/15/21   Terrilee Files, MD  docusate sodium (COLACE) 100 MG capsule Take 1 capsule (100 mg total) by mouth every 12 (twelve) hours. 05/28/22   Zadie Rhine, MD  doxycycline (VIBRAMYCIN) 100 MG capsule Take 1 capsule (100 mg total) by mouth 2 (two) times daily. One po bid x 7 days 05/28/22   Zadie Rhine, MD  hydrocortisone (ANUSOL-HC) 2.5 % rectal cream Place 1 application rectally 2 (two) times daily. 11/21/20   Sponseller, Eugene Gavia, PA-C      Allergies    Patient has no known allergies.    Review of Systems   Review of Systems  All other systems reviewed and are negative.   Physical Exam Updated Vital Signs BP (!) 150/88 (BP Location: Left Arm)   Pulse 67   Temp 98.2 F (36.8 C) (Oral)   Resp 18   Ht 6' (1.829 m)   Wt 99.8 kg   SpO2 99%   BMI 29.84 kg/m  Physical Exam  ED Results / Procedures / Treatments    Labs (all labs ordered are listed, but only abnormal results are displayed) Labs Reviewed - No data to display  EKG None  Radiology No results found.  Procedures Procedures    Medications Ordered in ED Medications - No data to display  ED Course/ Medical Decision Making/ A&P                             Medical Decision Making Risk Prescription drug management.   32 year old male presents today for medication refill.  He is presenting for refill on his Descovy.  He reports compliance.  Has 1 dose left.  He was instructed to come into the emergency department for refill according the patient.  He is without any complaints.  Will prescribe 30-day supply.  Will give 1 dose in the emergency department.  Discussed with attending.  Patient discharged in stable condition.  Return precautions discussed.   Final Clinical Impression(s) / ED Diagnoses Final diagnoses:  Medication refill    Rx / DC Orders ED Discharge Orders          Ordered    emtricitabine-tenofovir AF (DESCOVY) 200-25 MG tablet  Daily        08/16/22 1749  Marita Kansas, PA-C 08/16/22 1758    Terald Sleeper, MD 08/16/22 2116

## 2022-08-20 ENCOUNTER — Other Ambulatory Visit (HOSPITAL_COMMUNITY): Payer: Self-pay

## 2022-08-22 ENCOUNTER — Ambulatory Visit: Payer: BC Managed Care – PPO | Admitting: Pharmacist

## 2022-08-28 ENCOUNTER — Encounter: Payer: Self-pay | Admitting: Pharmacist

## 2022-09-03 ENCOUNTER — Telehealth: Payer: Self-pay

## 2022-09-03 ENCOUNTER — Other Ambulatory Visit (HOSPITAL_COMMUNITY): Payer: Self-pay

## 2022-09-03 NOTE — Telephone Encounter (Signed)
[facility] Patient Product/process development scientist completed.    The patient is insured through [employer].  Descovy was just filled on 08/16/22. Apretude is covered under Pharmacy benefits $0.00.  We will continue to follow to see if copay assistance is needed.  Clearance Coots, CPhT Specialty Pharmacy Patient [facility] for Infectious Disease Phone: [phone] Fax:  [phone]

## 2022-09-04 ENCOUNTER — Ambulatory Visit: Payer: BC Managed Care – PPO | Admitting: Pharmacist

## 2022-09-04 ENCOUNTER — Other Ambulatory Visit: Payer: Self-pay

## 2022-09-04 ENCOUNTER — Other Ambulatory Visit (HOSPITAL_COMMUNITY)
Admission: RE | Admit: 2022-09-04 | Discharge: 2022-09-04 | Disposition: A | Discharge status: Home / Self Care | Payer: BC Managed Care – PPO | Source: Ambulatory Visit | Attending: Family | Admitting: Family

## 2022-09-04 DIAGNOSIS — Z79899 Other long term (current) drug therapy: Secondary | ICD-10-CM | POA: Insufficient documentation

## 2022-09-04 DIAGNOSIS — Z113 Encounter for screening for infections with a predominantly sexual mode of transmission: Secondary | ICD-10-CM | POA: Insufficient documentation

## 2022-09-04 NOTE — Progress Notes (Signed)
Date:  09/04/2022   HPI: Mark Boone is a 32 y.o. male who presents to the [facility] pharmacy clinic for HIV PrEP follow-up.  Insured   [x]    Uninsured  []    Patient Active Problem List   Diagnosis Date Noted   High risk sexual behavior 09/25/2020   On pre-exposure prophylaxis for HIV 09/25/2020    Patient's Medications  New Prescriptions   No medications on file  Previous Medications   AZITHROMYCIN (ZITHROMAX) 250 MG TABLET    Take 1 tablet (250 mg total) by mouth daily. Take first 2 tablets together, then 1 every day until finished.   DOCUSATE SODIUM (COLACE) 100 MG CAPSULE    Take 1 capsule (100 mg total) by mouth every 12 (twelve) hours.   DOXYCYCLINE (VIBRAMYCIN) 100 MG CAPSULE    Take 1 capsule (100 mg total) by mouth 2 (two) times daily. One po bid x 7 days   EMTRICITABINE-TENOFOVIR AF (DESCOVY) 200-25 MG TABLET    Take 1 tablet by mouth daily.   HYDROCORTISONE (ANUSOL-HC) 2.5 % RECTAL CREAM    Place 1 application rectally 2 (two) times daily.  Modified Medications   No medications on file  Discontinued Medications   No medications on file    Allergies: No Known Allergies  Past Medical History: No past medical history on file.  Social History: Social History   Socioeconomic History   Marital status: Single    Spouse name: Not on file   Number of children: Not on file   Years of education: Not on file   Highest education level: Not on file  Occupational History   Not on file  Tobacco Use   Smoking status: Former    Types: Cigarettes   Smokeless tobacco: Never   Tobacco comments:    3 cigarettes/day  Vaping Use   Vaping Use: Some days  Substance and Sexual Activity   Alcohol use: Yes    Comment: Socially    Drug use: No   Sexual activity: Yes  Other Topics Concern   Not on file  Social History Narrative   Not on file   Social Determinants of Health   Financial Resource Strain: Not on file  Food Insecurity: Not on file  Transportation Needs:  Not on file  Physical Activity: Not on file  Stress: Not on file  Social Connections: Not on file       08/28/2021   11:51 AM  CHL HIV PREP FLOWSHEET RESULTS  Insurance Status Uninsured  Gender at birth Male  Gender identity cis-Male  Risk for HIV Hx of nPEP use  Sex Partners Men only  # sex partners past 3-6 mos 1  Sex activity preferences Insertive and receptive;Oral  Condom use Yes  % condom use 100  Treated for STI? No  HIV symptoms? None    Labs:  SCr: Lab Results  Component Value Date   CREATININE 0.76 05/28/2022   CREATININE 0.95 02/28/2022   CREATININE 0.91 01/15/2021   CREATININE 0.91 09/25/2020   CREATININE 1.11 05/17/2020   HIV Lab Results  Component Value Date   HIV NON-REACTIVE 05/22/2022   HIV NON-REACTIVE 02/28/2022   HIV NON-REACTIVE 11/14/2021   HIV NON-REACTIVE 08/28/2021   HIV NON REACTIVE 05/10/2021   Hepatitis B Lab Results  Component Value Date   HEPBSAB REACTIVE (A) 09/25/2020   HEPBSAG NON-REACTIVE 09/25/2020   Hepatitis C No results found for: "HEPCAB", "HCVRNAPCRQN" Hepatitis A No results found for: "HAV" RPR and STI Lab Results  Component Value Date   LABRPR NON-REACTIVE 05/22/2022   LABRPR NON-REACTIVE 02/28/2022   LABRPR NON-REACTIVE 11/14/2021   LABRPR NON-REACTIVE 08/28/2021   LABRPR NON REACTIVE 05/10/2021    STI Results GC CT  05/22/2022 10:45 AM Negative    Negative    Negative  Negative    Negative    Negative   02/28/2022  3:29 PM Negative    Negative    Negative  Negative    Negative    Negative   11/14/2021  2:13 PM Negative    Negative    Negative  Negative    Negative    Negative   08/28/2021 11:53 AM Negative    Negative    Negative  Negative    Negative    Negative   09/25/2020  2:44 PM Negative    Negative    Negative  Negative    Negative    Negative   07/05/2020  9:04 PM Negative  Negative   12/23/2019  2:07 PM Negative  Negative   05/25/2019 12:00 AM Negative  Negative    05/30/2017 12:00 AM Negative  Negative   03/22/2015 12:00 AM Negative  Negative     Assessment: Mark Boone presents today for HIV PrEP follow up. He is doing well on Descovy and taking it every day without missed doses. He did go to the ED on 08/16/22 for a refill as he said he called the emergency line and was told that since it was Friday evening, he would have to go there for a refill. They gave him a 30 day supply and asked him to follow up here. Advised him to please send me a mychart message if he is going to run out before our appointment together, and I would either move up his appointment or send in a 30 day supply for him.    Screened for acute HIV symptoms such as fatigue, muscle aches, rash, sore throat, lymphadenopathy, headache, night sweats, nausea/vomiting/diarrhea, and fever. Denies any symptoms. No exposure or symptoms of any STIs but agrees to full STI testing today with RPR and oral/urine/rectal cytologies. He would like it mailed from [facility] [place]. Declines all vaccines today.    Plan: - HIV antibody, RPR, and urine/rectal/pharyngeal cytologies for GC/chlamydia -  Descovy x 3 months if HIV negative - F/u with me on 11/28/22 (he should not run out before this appt)  Moss Berry L. Jannette Fogo, PharmD, BCIDP, AAHIVP, CPP Clinical Pharmacist Practitioner Infectious Diseases Clinical Pharmacist [facility] 09/04/2022, 11:27 AM

## 2022-09-05 ENCOUNTER — Other Ambulatory Visit: Payer: Self-pay

## 2022-09-05 ENCOUNTER — Telehealth: Payer: Self-pay | Admitting: Pharmacist

## 2022-09-05 ENCOUNTER — Other Ambulatory Visit: Payer: Self-pay | Admitting: Pharmacist

## 2022-09-05 ENCOUNTER — Other Ambulatory Visit (HOSPITAL_COMMUNITY): Payer: Self-pay

## 2022-09-05 ENCOUNTER — Ambulatory Visit (INDEPENDENT_AMBULATORY_CARE_PROVIDER_SITE_OTHER): Payer: BC Managed Care – PPO | Admitting: Pharmacist

## 2022-09-05 DIAGNOSIS — A549 Gonococcal infection, unspecified: Secondary | ICD-10-CM

## 2022-09-05 DIAGNOSIS — Z79899 Other long term (current) drug therapy: Secondary | ICD-10-CM

## 2022-09-05 DIAGNOSIS — Z113 Encounter for screening for infections with a predominantly sexual mode of transmission: Secondary | ICD-10-CM

## 2022-09-05 LAB — CYTOLOGY, (ORAL, ANAL, URETHRAL) ANCILLARY ONLY
Chlamydia: NEGATIVE
Chlamydia: NEGATIVE
Comment: NEGATIVE
Comment: NEGATIVE
Comment: NORMAL
Comment: NORMAL
Neisseria Gonorrhea: NEGATIVE
Neisseria Gonorrhea: POSITIVE — AB

## 2022-09-05 LAB — URINE CYTOLOGY ANCILLARY ONLY
Chlamydia: NEGATIVE
Comment: NEGATIVE
Comment: NORMAL
Neisseria Gonorrhea: NEGATIVE

## 2022-09-05 LAB — RPR: RPR Ser Ql: NONREACTIVE

## 2022-09-05 LAB — HIV ANTIBODY (ROUTINE TESTING W REFLEX): HIV 1&2 Ab, 4th Generation: NONREACTIVE

## 2022-09-05 MED ORDER — CEFTRIAXONE SODIUM 500 MG IJ SOLR
500.0000 mg | Freq: Once | INTRAMUSCULAR | Status: AC
  Administered 2022-09-05: 500 mg via INTRAMUSCULAR

## 2022-09-05 MED ORDER — DESCOVY 200-25 MG PO TABS
1.0000 | ORAL_TABLET | Freq: Every day | ORAL | 2 refills | Status: DC
  Filled 2022-09-05 (×2): qty 30, 30d supply, fill #0
  Filled 2022-10-08: qty 30, 30d supply, fill #1
  Filled 2022-10-29: qty 30, 30d supply, fill #2

## 2022-09-05 MED ORDER — DOXYCYCLINE HYCLATE 100 MG PO TABS
ORAL_TABLET | ORAL | 0 refills | Status: DC
  Filled 2022-09-05: qty 30, 15d supply, fill #0

## 2022-09-05 NOTE — Telephone Encounter (Signed)
Called Mark Boone regarding his positive rectal cytology for gonorrhea. He will come in today at 4pm for treatment.   Karman Veney L. Atiyana Welte, PharmD [facility] Clinical Pharmacist Practitioner

## 2022-09-05 NOTE — Progress Notes (Addendum)
HPI: Mark Boone is a 32 y.o. male who presents to the [facility] pharmacy clinic for gonorrhea treatment.  Patient Active Problem List   Diagnosis Date Noted   High risk sexual behavior 09/25/2020   On pre-exposure prophylaxis for HIV 09/25/2020    Patient's Medications  New Prescriptions   DOXYCYCLINE (VIBRA-TABS) 100 MG TABLET    Take 2 tablets (200 mg) by mouth 24-72 hours after a sexual encounter  Previous Medications   AZITHROMYCIN (ZITHROMAX) 250 MG TABLET    Take 1 tablet (250 mg total) by mouth daily. Take first 2 tablets together, then 1 every day until finished.   DOCUSATE SODIUM (COLACE) 100 MG CAPSULE    Take 1 capsule (100 mg total) by mouth every 12 (twelve) hours.   DOXYCYCLINE (VIBRAMYCIN) 100 MG CAPSULE    Take 1 capsule (100 mg total) by mouth 2 (two) times daily. One po bid x 7 days   EMTRICITABINE-TENOFOVIR AF (DESCOVY) 200-25 MG TABLET    Take 1 tablet by mouth daily.   HYDROCORTISONE (ANUSOL-HC) 2.5 % RECTAL CREAM    Place 1 application rectally 2 (two) times daily.  Modified Medications   No medications on file  Discontinued Medications   No medications on file    Allergies: No Known Allergies  Past Medical History: No past medical history on file.  Social History: Social History   Socioeconomic History   Marital status: Single    Spouse name: Not on file   Number of children: Not on file   Years of education: Not on file   Highest education level: Not on file  Occupational History   Not on file  Tobacco Use   Smoking status: Former    Types: Cigarettes   Smokeless tobacco: Never   Tobacco comments:    3 cigarettes/day  Vaping Use   Vaping Use: Some days  Substance and Sexual Activity   Alcohol use: Yes    Comment: Socially    Drug use: No   Sexual activity: Yes  Other Topics Concern   Not on file  Social History Narrative   Not on file   Social Determinants of Health   Financial Resource Strain: Not on file  Food Insecurity:  Not on file  Transportation Needs: Not on file  Physical Activity: Not on file  Stress: Not on file  Social Connections: Not on file    Labs: No results found for: "HIV1RNAQUANT", "HIV1RNAVL", "CD4TABS"  RPR and STI Lab Results  Component Value Date   LABRPR NON-REACTIVE 05/22/2022   LABRPR NON-REACTIVE 02/28/2022   LABRPR NON-REACTIVE 11/14/2021   LABRPR NON-REACTIVE 08/28/2021   LABRPR NON REACTIVE 05/10/2021    STI Results GC CT  09/04/2022 11:29 AM Positive    Negative    Negative  Negative    Negative    Negative   05/22/2022 10:45 AM Negative    Negative    Negative  Negative    Negative    Negative   02/28/2022  3:29 PM Negative    Negative    Negative  Negative    Negative    Negative   11/14/2021  2:13 PM Negative    Negative    Negative  Negative    Negative    Negative   08/28/2021 11:53 AM Negative    Negative    Negative  Negative    Negative    Negative   09/25/2020  2:44 PM Negative    Negative    Negative  Negative  Negative    Negative   07/05/2020  9:04 PM Negative  Negative   12/23/2019  2:07 PM Negative  Negative   05/25/2019 12:00 AM Negative  Negative   05/30/2017 12:00 AM Negative  Negative   03/22/2015 12:00 AM Negative  Negative     Hepatitis B Lab Results  Component Value Date   HEPBSAB REACTIVE (A) 09/25/2020   HEPBSAG NON-REACTIVE 09/25/2020   Hepatitis C No results found for: "HEPCAB", "HCVRNAPCRQN" Hepatitis A No results found for: "HAV" Lipids: Lab Results  Component Value Date   CHOL 140 02/28/2022   TRIG 136 02/28/2022   HDL 47 02/28/2022   CHOLHDL 3.0 02/28/2022   LDLCALC 71 02/28/2022    Assessment: Mark Boone presents today for gonorrhea treatment. His rectal swab returned positive on 09/04/22. Last STI screening was on 05/22/22 and was negative. He has never been treated for a STI before. He asked about doxyPEP. I counseled on what doxyPEP is and how to take it. Advised that it would greatly reduce his risk  of gonorrhea, chlamydia, and syphilis. He only had one partner but states that they ended things today. Will send in a prescription for him to have on hand if he needs it.   No known allergies to any antibiotics. Administered ceftriaxone 500 mg in right upper outer quadrant of the gluteal muscle. He tolerated well. Advised to abstain from sexual activity for 10 days and to ask partners to get tested and treated. His partner went to urgent care today for testing. Answered all questions. Will call with any issues.  Plan: - Administered Ceftriaxone 500 mg IM in gluteal muscle - DoxyPEP Rx sent to [facility] - Call with any issues or questions  Mark Boone L. Haeleigh Streiff, PharmD, BCIDP, AAHIVP, CPP Clinical Pharmacist Practitioner Infectious Diseases Clinical Pharmacist [facility]

## 2022-09-06 ENCOUNTER — Other Ambulatory Visit: Payer: Self-pay

## 2022-09-06 ENCOUNTER — Other Ambulatory Visit (HOSPITAL_COMMUNITY): Payer: Self-pay

## 2022-09-09 ENCOUNTER — Other Ambulatory Visit: Payer: Self-pay

## 2022-09-09 ENCOUNTER — Other Ambulatory Visit (HOSPITAL_COMMUNITY): Payer: Self-pay

## 2022-09-27 ENCOUNTER — Other Ambulatory Visit (HOSPITAL_COMMUNITY): Payer: Self-pay

## 2022-09-30 ENCOUNTER — Other Ambulatory Visit (HOSPITAL_COMMUNITY): Payer: Self-pay

## 2022-10-02 ENCOUNTER — Other Ambulatory Visit (HOSPITAL_COMMUNITY): Payer: Self-pay

## 2022-10-08 ENCOUNTER — Other Ambulatory Visit (HOSPITAL_COMMUNITY): Payer: Self-pay

## 2022-10-29 ENCOUNTER — Other Ambulatory Visit (HOSPITAL_COMMUNITY): Payer: Self-pay

## 2022-11-01 ENCOUNTER — Other Ambulatory Visit (HOSPITAL_COMMUNITY): Payer: Self-pay

## 2022-11-21 ENCOUNTER — Other Ambulatory Visit (HOSPITAL_COMMUNITY): Payer: Self-pay

## 2022-11-28 ENCOUNTER — Ambulatory Visit: Payer: BC Managed Care – PPO | Admitting: Pharmacist

## 2022-12-05 ENCOUNTER — Other Ambulatory Visit (HOSPITAL_COMMUNITY): Payer: Self-pay

## 2022-12-10 ENCOUNTER — Other Ambulatory Visit: Payer: Self-pay

## 2022-12-11 ENCOUNTER — Ambulatory Visit (INDEPENDENT_AMBULATORY_CARE_PROVIDER_SITE_OTHER): Payer: BC Managed Care – PPO | Admitting: Pharmacist

## 2022-12-11 ENCOUNTER — Other Ambulatory Visit: Payer: Self-pay

## 2022-12-11 ENCOUNTER — Other Ambulatory Visit (HOSPITAL_COMMUNITY)
Admission: RE | Admit: 2022-12-11 | Discharge: 2022-12-11 | Disposition: A | Discharge status: Home / Self Care | Payer: BC Managed Care – PPO | Source: Ambulatory Visit | Attending: Family | Admitting: Family

## 2022-12-11 ENCOUNTER — Other Ambulatory Visit: Payer: Self-pay | Admitting: Pharmacist

## 2022-12-11 DIAGNOSIS — Z113 Encounter for screening for infections with a predominantly sexual mode of transmission: Secondary | ICD-10-CM | POA: Diagnosis present

## 2022-12-11 DIAGNOSIS — Z79899 Other long term (current) drug therapy: Secondary | ICD-10-CM

## 2022-12-11 MED ORDER — DESCOVY 200-25 MG PO TABS
1.0000 | ORAL_TABLET | Freq: Every day | ORAL | 2 refills | Status: DC
Start: 2022-12-11 — End: 2023-03-17
  Filled 2022-12-11: qty 30, 30d supply, fill #0
  Filled 2022-12-31: qty 30, 30d supply, fill #1
  Filled 2023-02-06: qty 30, 30d supply, fill #2

## 2022-12-11 NOTE — Progress Notes (Signed)
Date:  12/11/2022   HPI: Mark Boone is a 32 y.o. male who presents to the [facility] pharmacy clinic for HIV PrEP follow-up.  Insured   [x]    Uninsured  []    Patient Active Problem List   Diagnosis Date Noted   High risk sexual behavior 09/25/2020   On pre-exposure prophylaxis for HIV 09/25/2020    Patient's Medications  New Prescriptions   No medications on file  Previous Medications   AZITHROMYCIN (ZITHROMAX) 250 MG TABLET    Take 1 tablet (250 mg total) by mouth daily. Take first 2 tablets together, then 1 every day until finished.   DOCUSATE SODIUM (COLACE) 100 MG CAPSULE    Take 1 capsule (100 mg total) by mouth every 12 (twelve) hours.   DOXYCYCLINE (VIBRA-TABS) 100 MG TABLET    Take 2 tablets (200 mg) by mouth 24-72 hours after a sexual encounter   HYDROCORTISONE (ANUSOL-HC) 2.5 % RECTAL CREAM    Place 1 application rectally 2 (two) times daily.  Modified Medications   Modified Medication Previous Medication   EMTRICITABINE-TENOFOVIR AF (DESCOVY) 200-25 MG TABLET emtricitabine-tenofovir AF (DESCOVY) 200-25 MG tablet      Take 1 tablet by mouth daily.    Take 1 tablet by mouth daily.  Discontinued Medications   No medications on file    Allergies: No Known Allergies  Past Medical History: No past medical history on file.  Social History: Social History   Socioeconomic History   Marital status: Single    Spouse name: Not on file   Number of children: Not on file   Years of education: Not on file   Highest education level: Not on file  Occupational History   Not on file  Tobacco Use   Smoking status: Former    Types: Cigarettes   Smokeless tobacco: Never   Tobacco comments:    3 cigarettes/day  Vaping Use   Vaping status: Some Days  Substance and Sexual Activity   Alcohol use: Yes    Comment: Socially    Drug use: No   Sexual activity: Yes  Other Topics Concern   Not on file  Social History Narrative   Not on file   Social Determinants of Health    Financial Resource Strain: Not on file  Food Insecurity: Not on file  Transportation Needs: Not on file  Physical Activity: Not on file  Stress: Not on file  Social Connections: Unknown (07/31/2021)   Received from [facility]   Social Network    Social Network: Not on file       08/28/2021   11:51 AM  CHL HIV PREP FLOWSHEET RESULTS  Insurance Status Uninsured  Gender at birth Male  Gender identity cis-Male  Risk for HIV Hx of nPEP use  Sex Partners Men only  # sex partners past 3-6 mos 1  Sex activity preferences Insertive and receptive;Oral  Condom use Yes  % condom use 100  Treated for STI? No  HIV symptoms? None    Labs:  SCr: Lab Results  Component Value Date   CREATININE 0.76 05/28/2022   CREATININE 0.95 02/28/2022   CREATININE 0.91 01/15/2021   CREATININE 0.91 09/25/2020   CREATININE 1.11 05/17/2020   HIV Lab Results  Component Value Date   HIV NON-REACTIVE 09/04/2022   HIV NON-REACTIVE 05/22/2022   HIV NON-REACTIVE 02/28/2022   HIV NON-REACTIVE 11/14/2021   HIV NON-REACTIVE 08/28/2021   Hepatitis B Lab Results  Component Value Date   HEPBSAB REACTIVE (  A) 09/25/2020   HEPBSAG NON-REACTIVE 09/25/2020   Hepatitis C No results found for: "HEPCAB", "HCVRNAPCRQN" Hepatitis A No results found for: "HAV" RPR and STI Lab Results  Component Value Date   LABRPR NON-REACTIVE 09/04/2022   LABRPR NON-REACTIVE 05/22/2022   LABRPR NON-REACTIVE 02/28/2022   LABRPR NON-REACTIVE 11/14/2021   LABRPR NON-REACTIVE 08/28/2021    STI Results GC CT  09/04/2022 11:29 AM Positive    Negative    Negative  Negative    Negative    Negative   05/22/2022 10:45 AM Negative    Negative    Negative  Negative    Negative    Negative   02/28/2022  3:29 PM Negative    Negative    Negative  Negative    Negative    Negative   11/14/2021  2:13 PM Negative    Negative    Negative  Negative    Negative    Negative   08/28/2021 11:53 AM Negative     Negative    Negative  Negative    Negative    Negative   09/25/2020  2:44 PM Negative    Negative    Negative  Negative    Negative    Negative   07/05/2020  9:04 PM Negative  Negative   12/23/2019  2:07 PM Negative  Negative   05/25/2019 12:00 AM Negative  Negative   05/30/2017 12:00 AM Negative  Negative   03/22/2015 12:00 AM Negative  Negative     Assessment: Donal presents to clinic today for PrEP follow-up. Denies any missed doses or adverse effects.  Screened patient for acute HIV symptoms such as fatigue, muscle aches, rash, sore throat, lymphadenopathy, headache, night sweats, nausea/vomiting/diarrhea, and fever.  Ran out of tablets yesterday, so provided him with one week of Descovy samples to last until his refill is mailed to him. No new issues with [facility] delivery. Will check HIV antibody and send in refill once it results negative.  Last STI screening was in June and was negative. No new sexual partners since last visit; will check full STI testing today. Declines all vaccines today.   Plan: - Check HIV antibody, RPR, and urine/oral/rectal cytologies - Refill Descovy x 3 months - Follow-up with me on 12/13   Margarite Gouge, PharmD, CPP, BCIDP, AAHIVP Clinical Pharmacist Practitioner Infectious Diseases Clinical Pharmacist [facility] 12/11/2022, 11:39 AM

## 2022-12-11 NOTE — Progress Notes (Signed)
Specialty Pharmacy Refill Coordination Note  Mark Boone is a 32 y.o. male contacted today regarding refills of specialty medication(s) Emtricitabine-Tenofovir Af .  Patient requested Delivery  on 12/13/22  to verified address [address]   [place] [address]   Medication will be filled on 12/11/2022.

## 2022-12-12 ENCOUNTER — Other Ambulatory Visit: Payer: Self-pay

## 2022-12-12 ENCOUNTER — Other Ambulatory Visit: Payer: Self-pay | Admitting: Pharmacist

## 2022-12-12 DIAGNOSIS — Z79899 Other long term (current) drug therapy: Secondary | ICD-10-CM

## 2022-12-12 LAB — CYTOLOGY, (ORAL, ANAL, URETHRAL) ANCILLARY ONLY
Chlamydia: NEGATIVE
Chlamydia: NEGATIVE
Comment: NEGATIVE
Comment: NEGATIVE
Comment: NORMAL
Comment: NORMAL
Neisseria Gonorrhea: NEGATIVE
Neisseria Gonorrhea: NEGATIVE

## 2022-12-12 LAB — URINE CYTOLOGY ANCILLARY ONLY
Chlamydia: NEGATIVE
Comment: NEGATIVE
Comment: NORMAL
Neisseria Gonorrhea: NEGATIVE

## 2022-12-12 LAB — HIV ANTIBODY (ROUTINE TESTING W REFLEX): HIV 1&2 Ab, 4th Generation: NONREACTIVE

## 2022-12-12 MED ORDER — DESCOVY 200-25 MG PO TABS
1.0000 | ORAL_TABLET | Freq: Every day | ORAL | Status: AC
Start: 2022-12-11 — End: 2022-12-18

## 2022-12-12 NOTE — Progress Notes (Signed)
Medication Samples have been provided to the patient.  Drug name: Descovy     Strength: 200/25 mg    Qty: 7 tablets (1 bottles) LOT: [id number]   Exp.Date: 9/26  Dosing instructions: Take one tablet by mouth once daily.   The patient has been instructed regarding the correct time, dose, and frequency of taking this medication, including desired effects and most common side effects.   Margarite Gouge, PharmD, CPP, BCIDP, AAHIVP Clinical Pharmacist Practitioner Infectious Diseases Clinical Pharmacist [facility]

## 2022-12-31 ENCOUNTER — Other Ambulatory Visit (HOSPITAL_COMMUNITY): Payer: Self-pay | Admitting: Pharmacy Technician

## 2022-12-31 ENCOUNTER — Other Ambulatory Visit (HOSPITAL_COMMUNITY): Payer: Self-pay

## 2022-12-31 NOTE — Progress Notes (Signed)
Specialty Pharmacy Refill Coordination Note  Mark Boone is a 32 y.o. male contacted today regarding refills of specialty medication(s) Emtricitabine-Tenofovir Af   Patient requested Delivery   Delivery date: 01/08/23   Verified address: [address]  [place]   Medication will be filled on 01/07/23.

## 2023-01-07 ENCOUNTER — Other Ambulatory Visit: Payer: Self-pay

## 2023-01-22 ENCOUNTER — Other Ambulatory Visit (HOSPITAL_COMMUNITY): Payer: Self-pay

## 2023-01-24 ENCOUNTER — Other Ambulatory Visit: Payer: Self-pay

## 2023-01-29 ENCOUNTER — Other Ambulatory Visit: Payer: Self-pay

## 2023-02-03 ENCOUNTER — Other Ambulatory Visit: Payer: Self-pay

## 2023-02-05 ENCOUNTER — Other Ambulatory Visit: Payer: Self-pay

## 2023-02-06 ENCOUNTER — Other Ambulatory Visit: Payer: Self-pay

## 2023-02-06 NOTE — Progress Notes (Signed)
Specialty Pharmacy Refill Coordination Note  Mark Boone is a 32 y.o. male contacted today regarding refills of specialty medication(s) Emtricitabine-Tenofovir Af   Patient requested Delivery   Delivery date: 02/07/23   Verified address: [address] [place] [address]   Medication will be filled on 02/06/23.

## 2023-02-24 ENCOUNTER — Other Ambulatory Visit: Payer: Self-pay

## 2023-02-25 ENCOUNTER — Other Ambulatory Visit: Payer: Self-pay

## 2023-02-25 NOTE — Progress Notes (Unsigned)
Date:  02/25/2023   HPI: Mark Boone is a 32 y.o. male who presents to the [facility] pharmacy clinic for HIV PrEP follow-up.  Insured   [x]    Uninsured  []    Patient Active Problem List   Diagnosis Date Noted   High risk sexual behavior 09/25/2020   On pre-exposure prophylaxis for HIV 09/25/2020    Patient's Medications  New Prescriptions   No medications on file  Previous Medications   AZITHROMYCIN (ZITHROMAX) 250 MG TABLET    Take 1 tablet (250 mg total) by mouth daily. Take first 2 tablets together, then 1 every day until finished.   DOCUSATE SODIUM (COLACE) 100 MG CAPSULE    Take 1 capsule (100 mg total) by mouth every 12 (twelve) hours.   DOXYCYCLINE (VIBRA-TABS) 100 MG TABLET    Take 2 tablets (200 mg) by mouth 24-72 hours after a sexual encounter   EMTRICITABINE-TENOFOVIR AF (DESCOVY) 200-25 MG TABLET    Take 1 tablet by mouth daily.   HYDROCORTISONE (ANUSOL-HC) 2.5 % RECTAL CREAM    Place 1 application rectally 2 (two) times daily.  Modified Medications   No medications on file  Discontinued Medications   No medications on file    Allergies: No Known Allergies  Past Medical History: No past medical history on file.  Social History: Social History   Socioeconomic History   Marital status: Single    Spouse name: Not on file   Number of children: Not on file   Years of education: Not on file   Highest education level: Not on file  Occupational History   Not on file  Tobacco Use   Smoking status: Former    Types: Cigarettes   Smokeless tobacco: Never   Tobacco comments:    3 cigarettes/day  Vaping Use   Vaping status: Some Days  Substance and Sexual Activity   Alcohol use: Yes    Comment: Socially    Drug use: No   Sexual activity: Yes  Other Topics Concern   Not on file  Social History Narrative   Not on file   Social Determinants of Health   Financial Resource Strain: Not on file  Food Insecurity: Not on file  Transportation Needs: Not on  file  Physical Activity: Not on file  Stress: Not on file  Social Connections: Unknown (07/31/2021)   Received from [facility]   Social Network    Social Network: Not on file       08/28/2021   11:51 AM  [facility] HIV PREP FLOWSHEET RESULTS  Insurance Status Uninsured  Gender at birth Male  Gender identity cis-Male  Risk for HIV Hx of nPEP use  Sex Partners Men only  # sex partners past 3-6 mos 1  Sex activity preferences Insertive and receptive;Oral  Condom use Yes  % condom use 100  Treated for STI? No  HIV symptoms? None    Labs:  SCr: Lab Results  Component Value Date   CREATININE 0.76 05/28/2022   CREATININE 0.95 02/28/2022   CREATININE 0.91 01/15/2021   CREATININE 0.91 09/25/2020   CREATININE 1.11 05/17/2020   HIV Lab Results  Component Value Date   HIV NON-REACTIVE 12/11/2022   HIV NON-REACTIVE 09/04/2022   HIV NON-REACTIVE 05/22/2022   HIV NON-REACTIVE 02/28/2022   HIV NON-REACTIVE 11/14/2021   Hepatitis B Lab Results  Component Value Date   HEPBSAB REACTIVE (A) 09/25/2020   HEPBSAG NON-REACTIVE 09/25/2020   Hepatitis C No results found for: "HEPCAB", "HCVRNAPCRQN"  Hepatitis A No results found for: "HAV" RPR and STI Lab Results  Component Value Date   LABRPR NON-REACTIVE 09/04/2022   LABRPR NON-REACTIVE 05/22/2022   LABRPR NON-REACTIVE 02/28/2022   LABRPR NON-REACTIVE 11/14/2021   LABRPR NON-REACTIVE 08/28/2021    STI Results GC CT  12/11/2022 11:23 AM Negative    Negative    Negative  Negative    Negative    Negative   09/04/2022 11:29 AM Positive    Negative    Negative  Negative    Negative    Negative   05/22/2022 10:45 AM Negative    Negative    Negative  Negative    Negative    Negative   02/28/2022  3:29 PM Negative    Negative    Negative  Negative    Negative    Negative   11/14/2021  2:13 PM Negative    Negative    Negative  Negative    Negative    Negative   08/28/2021 11:53 AM Negative     Negative    Negative  Negative    Negative    Negative   09/25/2020  2:44 PM Negative    Negative    Negative  Negative    Negative    Negative   07/05/2020  9:04 PM Negative  Negative   12/23/2019  2:07 PM Negative  Negative   05/25/2019 12:00 AM Negative  Negative   05/30/2017 12:00 AM Negative  Negative   03/22/2015 12:00 AM Negative  Negative     Assessment: Bun presents to clinic today for PrEP follow-up. Denies any missed doses or adverse effects.  Screened patient for acute HIV symptoms such as fatigue, muscle aches, rash, sore throat, lymphadenopathy, headache, night sweats, nausea/vomiting/diarrhea, and fever.  Still has ~*** tablets left over. No new issues with [facility] delivery. Will check HIV antibody and send in refill once it results negative. Will also check routine lipid screening today.   Last STI screening was in September and was negative. *** new sexual partners since last visit; will check urine/oral/rectal cytologies today.  Plan: - Check HIV antibody, lipid profile, RPR, and urine/oral/rectal cytologies - If HIV antibody, refill Descovy x 3 months - Follow-up with *** on ***   Margarite Gouge, PharmD, CPP, BCIDP, AAHIVP Clinical Pharmacist Practitioner Infectious Diseases Clinical Pharmacist [facility] 02/25/2023, 3:01 PM

## 2023-02-28 ENCOUNTER — Ambulatory Visit: Payer: BC Managed Care – PPO | Admitting: Pharmacist

## 2023-02-28 DIAGNOSIS — Z79899 Other long term (current) drug therapy: Secondary | ICD-10-CM

## 2023-02-28 DIAGNOSIS — Z113 Encounter for screening for infections with a predominantly sexual mode of transmission: Secondary | ICD-10-CM

## 2023-03-13 ENCOUNTER — Other Ambulatory Visit (HOSPITAL_COMMUNITY): Payer: Self-pay

## 2023-03-13 ENCOUNTER — Other Ambulatory Visit: Payer: Self-pay | Admitting: Pharmacist

## 2023-03-13 DIAGNOSIS — Z79899 Other long term (current) drug therapy: Secondary | ICD-10-CM

## 2023-03-17 ENCOUNTER — Other Ambulatory Visit: Payer: Self-pay | Admitting: Pharmacist

## 2023-03-17 DIAGNOSIS — Z79899 Other long term (current) drug therapy: Secondary | ICD-10-CM

## 2023-03-17 MED ORDER — DESCOVY 200-25 MG PO TABS: 1.0000 | ORAL_TABLET | Freq: Every day | ORAL | Status: AC

## 2023-03-17 NOTE — Progress Notes (Signed)
Medication Samples have been provided to the patient.  Drug name: Descovy        Strength: 200/25 mg       Qty: 1 package (7 tablets)   LOT: [id number]  Exp.Date: 12/15/2024  Dosing instructions: Take one tablet by mouth once daily  The patient has been instructed regarding the correct time, dose, and frequency of taking this medication, including desired effects and most common side effects.   Navraj Dreibelbis L. Jannette Fogo, PharmD, BCIDP, AAHIVP, CPP Clinical Pharmacist Practitioner Infectious Diseases Clinical Pharmacist [facility] 02/28/2020, 10:07 AM

## 2023-03-18 NOTE — Progress Notes (Deleted)
 Date:  03/18/2023   HPI: Mark Boone is a 32 y.o. male who presents to the [facility] pharmacy clinic for HIV PrEP follow-up.  Insured   [x]    Uninsured  []    Patient Active Problem List   Diagnosis Date Noted   High risk sexual behavior 09/25/2020   On pre-exposure prophylaxis for HIV 09/25/2020    Patient's Medications  New Prescriptions   No medications on file  Previous Medications   AZITHROMYCIN  (ZITHROMAX ) 250 MG TABLET    Take 1 tablet (250 mg total) by mouth daily. Take first 2 tablets together, then 1 every day until finished.   DOCUSATE SODIUM  (COLACE) 100 MG CAPSULE    Take 1 capsule (100 mg total) by mouth every 12 (twelve) hours.   DOXYCYCLINE  (VIBRA -TABS) 100 MG TABLET    Take 2 tablets (200 mg) by mouth 24-72 hours after a sexual encounter   EMTRICITABINE -TENOFOVIR  AF (DESCOVY ) 200-25 MG TABLET    Take 1 tablet by mouth daily for 7 days.   HYDROCORTISONE  (ANUSOL -HC) 2.5 % RECTAL CREAM    Place 1 application rectally 2 (two) times daily.  Modified Medications   No medications on file  Discontinued Medications   No medications on file    Allergies: No Known Allergies  Past Medical History: No past medical history on file.  Social History: Social History   Socioeconomic History   Marital status: Single    Spouse name: Not on file   Number of children: Not on file   Years of education: Not on file   Highest education level: Not on file  Occupational History   Not on file  Tobacco Use   Smoking status: Former    Types: Cigarettes   Smokeless tobacco: Never   Tobacco comments:    3 cigarettes/day  Vaping Use   Vaping status: Some Days  Substance and Sexual Activity   Alcohol use: Yes    Comment: Socially    Drug use: No   Sexual activity: Yes  Other Topics Concern   Not on file  Social History Narrative   Not on file   Social Drivers of Health   Financial Resource Strain: Not on file  Food Insecurity: Not on file  Transportation Needs:  Not on file  Physical Activity: Not on file  Stress: Not on file  Social Connections: Unknown (07/31/2021)   Received from [facility]   Social Network    Social Network: Not on file       08/28/2021   11:51 AM  [facility] HIV PREP FLOWSHEET RESULTS  Insurance Status Uninsured  Gender at birth Male  Gender identity cis-Male  Risk for HIV Hx of nPEP use  Sex Partners Men only  # sex partners past 3-6 mos 1  Sex activity preferences Insertive and receptive;Oral  Condom use Yes  % condom use 100  Treated for STI? No  HIV symptoms? None    Labs:  SCr: Lab Results  Component Value Date   CREATININE 0.76 05/28/2022   CREATININE 0.95 02/28/2022   CREATININE 0.91 01/15/2021   CREATININE 0.91 09/25/2020   CREATININE 1.11 05/17/2020   HIV Lab Results  Component Value Date   HIV NON-REACTIVE 12/11/2022   HIV NON-REACTIVE 09/04/2022   HIV NON-REACTIVE 05/22/2022   HIV NON-REACTIVE 02/28/2022   HIV NON-REACTIVE 11/14/2021   Hepatitis B Lab Results  Component Value Date   HEPBSAB REACTIVE (A) 09/25/2020   HEPBSAG NON-REACTIVE 09/25/2020   Hepatitis C No results found  for: HEPCAB, HCVRNAPCRQN Hepatitis A No results found for: HAV RPR and STI Lab Results  Component Value Date   LABRPR NON-REACTIVE 09/04/2022   LABRPR NON-REACTIVE 05/22/2022   LABRPR NON-REACTIVE 02/28/2022   LABRPR NON-REACTIVE 11/14/2021   LABRPR NON-REACTIVE 08/28/2021    STI Results GC CT  12/11/2022 11:23 AM Negative    Negative    Negative  Negative    Negative    Negative   09/04/2022 11:29 AM Positive    Negative    Negative  Negative    Negative    Negative   05/22/2022 10:45 AM Negative    Negative    Negative  Negative    Negative    Negative   02/28/2022  3:29 PM Negative    Negative    Negative  Negative    Negative    Negative   11/14/2021  2:13 PM Negative    Negative    Negative  Negative    Negative    Negative   08/28/2021 11:53 AM Negative     Negative    Negative  Negative    Negative    Negative   09/25/2020  2:44 PM Negative    Negative    Negative  Negative    Negative    Negative   07/05/2020  9:04 PM Negative  Negative   12/23/2019  2:07 PM Negative  Negative   05/25/2019 12:00 AM Negative  Negative   05/30/2017 12:00 AM Negative  Negative   03/22/2015 12:00 AM Negative  Negative     Assessment: [name] presents to clinic today for PrEP follow-up. Denies any missed doses or adverse effects.  Screened patient for acute HIV symptoms such as fatigue, muscle aches, rash, sore throat, lymphadenopathy, headache, night sweats, nausea/vomiting/diarrhea, and fever.  Still has ~*** tablets left over. No new issues with [facility] delivery. Will check HIV antibody and send in refill once it results negative. Will also check routine lipid screening today.   Last STI screening was in September and was negative. *** new sexual partners since last visit; will check urine/oral/rectal cytologies today.  Plan: - Check HIV antibody, lipid profile, RPR, and urine/oral/rectal cytologies - If HIV antibody, refill Descovy  x 3 months - Follow-up with *** on ***   Alan Geralds, PharmD, CPP, BCIDP, AAHIVP Clinical Pharmacist Practitioner Infectious Diseases Clinical Pharmacist [facility] 03/18/2023, 3:35 PM

## 2023-03-20 ENCOUNTER — Other Ambulatory Visit: Payer: Self-pay

## 2023-03-20 ENCOUNTER — Ambulatory Visit: Payer: BC Managed Care – PPO | Admitting: Pharmacist

## 2023-03-20 ENCOUNTER — Other Ambulatory Visit (HOSPITAL_COMMUNITY): Payer: Self-pay

## 2023-03-20 ENCOUNTER — Ambulatory Visit (INDEPENDENT_AMBULATORY_CARE_PROVIDER_SITE_OTHER): Payer: BC Managed Care – PPO | Admitting: Pharmacist

## 2023-03-20 DIAGNOSIS — Z79899 Other long term (current) drug therapy: Secondary | ICD-10-CM

## 2023-03-20 DIAGNOSIS — Z113 Encounter for screening for infections with a predominantly sexual mode of transmission: Secondary | ICD-10-CM

## 2023-03-20 MED ORDER — DESCOVY 200-25 MG PO TABS
1.0000 | ORAL_TABLET | Freq: Every day | ORAL | 2 refills | Status: DC
  Filled 2023-03-20: qty 30, 30d supply, fill #0
  Filled 2023-04-16: qty 30, 30d supply, fill #1
  Filled 2023-05-15: qty 30, 30d supply, fill #2

## 2023-03-20 NOTE — Progress Notes (Signed)
 Date:  03/20/2023   HPI: Mark Boone is a 33 y.o. male who presents to the [facility] pharmacy clinic for HIV PrEP follow-up.  Insured   [x]    Uninsured  []    Patient Active Problem List   Diagnosis Date Noted   High risk sexual behavior 09/25/2020   On pre-exposure prophylaxis for HIV 09/25/2020    Patient's Medications  New Prescriptions   No medications on file  Previous Medications   AZITHROMYCIN  (ZITHROMAX ) 250 MG TABLET    Take 1 tablet (250 mg total) by mouth daily. Take first 2 tablets together, then 1 every day until finished.   DOCUSATE SODIUM  (COLACE) 100 MG CAPSULE    Take 1 capsule (100 mg total) by mouth every 12 (twelve) hours.   DOXYCYCLINE  (VIBRA -TABS) 100 MG TABLET    Take 2 tablets (200 mg) by mouth 24-72 hours after a sexual encounter   EMTRICITABINE -TENOFOVIR  AF (DESCOVY ) 200-25 MG TABLET    Take 1 tablet by mouth daily for 7 days.   HYDROCORTISONE  (ANUSOL -HC) 2.5 % RECTAL CREAM    Place 1 application rectally 2 (two) times daily.  Modified Medications   No medications on file  Discontinued Medications   No medications on file    Allergies: No Known Allergies  Past Medical History: No past medical history on file.  Social History: Social History   Socioeconomic History   Marital status: Single    Spouse name: Not on file   Number of children: Not on file   Years of education: Not on file   Highest education level: Not on file  Occupational History   Not on file  Tobacco Use   Smoking status: Former    Types: Cigarettes   Smokeless tobacco: Never   Tobacco comments:    3 cigarettes/day  Vaping Use   Vaping status: Some Days  Substance and Sexual Activity   Alcohol use: Yes    Comment: Socially    Drug use: No   Sexual activity: Yes  Other Topics Concern   Not on file  Social History Narrative   Not on file   Social Drivers of Health   Financial Resource Strain: Not on file  Food Insecurity: Not on file  Transportation Needs: Not  on file  Physical Activity: Not on file  Stress: Not on file  Social Connections: Unknown (07/31/2021)   Received from [facility]   Social Network    Social Network: Not on file       08/28/2021   11:51 AM  [facility] HIV PREP FLOWSHEET RESULTS  Insurance Status Uninsured  Gender at birth Male  Gender identity cis-Male  Risk for HIV Hx of nPEP use  Sex Partners Men only  # sex partners past 3-6 mos 1  Sex activity preferences Insertive and receptive;Oral  Condom use Yes  % condom use 100  Treated for STI? No  HIV symptoms? None    Labs:  SCr: Lab Results  Component Value Date   CREATININE 0.76 05/28/2022   CREATININE 0.95 02/28/2022   CREATININE 0.91 01/15/2021   CREATININE 0.91 09/25/2020   CREATININE 1.11 05/17/2020   HIV Lab Results  Component Value Date   HIV NON-REACTIVE 12/11/2022   HIV NON-REACTIVE 09/04/2022   HIV NON-REACTIVE 05/22/2022   HIV NON-REACTIVE 02/28/2022   HIV NON-REACTIVE 11/14/2021   Hepatitis B Lab Results  Component Value Date   HEPBSAB REACTIVE (A) 09/25/2020   HEPBSAG NON-REACTIVE 09/25/2020   Hepatitis C No results found  for: HEPCAB, HCVRNAPCRQN Hepatitis A No results found for: HAV RPR and STI Lab Results  Component Value Date   LABRPR NON-REACTIVE 09/04/2022   LABRPR NON-REACTIVE 05/22/2022   LABRPR NON-REACTIVE 02/28/2022   LABRPR NON-REACTIVE 11/14/2021   LABRPR NON-REACTIVE 08/28/2021    STI Results GC CT  12/11/2022 11:23 AM Negative    Negative    Negative  Negative    Negative    Negative   09/04/2022 11:29 AM Positive    Negative    Negative  Negative    Negative    Negative   05/22/2022 10:45 AM Negative    Negative    Negative  Negative    Negative    Negative   02/28/2022  3:29 PM Negative    Negative    Negative  Negative    Negative    Negative   11/14/2021  2:13 PM Negative    Negative    Negative  Negative    Negative    Negative   08/28/2021 11:53 AM Negative     Negative    Negative  Negative    Negative    Negative   09/25/2020  2:44 PM Negative    Negative    Negative  Negative    Negative    Negative   07/05/2020  9:04 PM Negative  Negative   12/23/2019  2:07 PM Negative  Negative   05/25/2019 12:00 AM Negative  Negative   05/30/2017 12:00 AM Negative  Negative   03/22/2015 12:00 AM Negative  Negative     Assessment: Mark Boone presents to clinic today for PrEP follow-up. Denies any missed doses or adverse effects.  Screened patient for acute HIV symptoms such as fatigue, muscle aches, rash, sore throat, lymphadenopathy, headache, night sweats, nausea/vomiting/diarrhea, and fever. Ran out of tablets last month and received 7 days of samples later in December given he missed appointments. No new issues with [facility] delivery. Will check HIV antibody and routine lipid screening today. Will send in refills today given he is out of medication and has not been active with any new partners; would like to pick up today and will continue delivery after that.   Last STI screening was in September and was negative. No new sexual partners since last visit; will still check urine/oral/rectal cytologies today.  Plan: - Check HIV antibody, lipid profile, RPR, and urine/oral/rectal cytologies - Refill Descovy  x 3 months - Follow-up with me on 3/28    Alan Geralds, PharmD, CPP, BCIDP, AAHIVP Clinical Pharmacist Practitioner Infectious Diseases Clinical Pharmacist [facility] 03/20/2023, 3:07 PM

## 2023-03-20 NOTE — Patient Instructions (Signed)
 [facility] - Address: [address] [place], [place], [place] [address]

## 2023-03-21 LAB — LIPID PANEL
Cholesterol: 128 mg/dL (ref <200)
HDL: 46 mg/dL (ref >=40)
LDL Cholesterol (Calc): 56 mg/dL
Non-HDL Cholesterol (Calc): 82 mg/dL (ref <130)
Total CHOL/HDL Ratio: 2.8 (calc) (ref <5.0)
Triglycerides: 184 mg/dL — ABNORMAL HIGH (ref <150)

## 2023-03-21 LAB — C. TRACHOMATIS/N. GONORRHOEAE RNA
C. trachomatis RNA, TMA: NOT DETECTED
N. gonorrhoeae RNA, TMA: NOT DETECTED

## 2023-03-21 LAB — HIV ANTIBODY (ROUTINE TESTING W REFLEX): HIV 1&2 Ab, 4th Generation: NONREACTIVE

## 2023-03-21 LAB — GC/CHLAMYDIA PROBE, AMP (THROAT)
Chlamydia trachomatis RNA: NOT DETECTED
Neisseria gonorrhoeae RNA: NOT DETECTED

## 2023-03-21 LAB — RPR: RPR Ser Ql: NONREACTIVE

## 2023-03-21 LAB — CT/NG RNA, TMA RECTAL
Chlamydia Trachomatis RNA: NOT DETECTED
Neisseria Gonorrhoeae RNA: NOT DETECTED

## 2023-03-25 ENCOUNTER — Ambulatory Visit: Payer: BC Managed Care – PPO | Admitting: Pharmacist

## 2023-04-04 ENCOUNTER — Other Ambulatory Visit: Payer: Self-pay

## 2023-04-04 ENCOUNTER — Other Ambulatory Visit: Payer: Self-pay | Admitting: Pharmacist

## 2023-04-04 NOTE — Progress Notes (Signed)
Specialty Pharmacy Refill Coordination Note  Mark Boone is a 33 y.o. male contacted today regarding refills of specialty medication(s) Descovy  Patient requested [place] date: 03/20/23  Medication will be filled on 03/20/23.

## 2023-04-04 NOTE — Progress Notes (Signed)
Specialty Pharmacy Ongoing Clinical Assessment Note  Mark Boone is a 33 y.o. male who is being followed by the specialty pharmacy service for RxSp HIV PrEP   Patient's specialty medication(s) reviewed today: Emtricitabine-Tenofovir AF (Descovy)   Missed doses in the last 4 weeks: More than 5   Patient/Caregiver did not have any additional questions or concerns.   Therapeutic benefit summary: Patient is achieving benefit   Adverse events/side effects summary: No adverse events/side effects   Patient's therapy is appropriate to: Continue    Goals Addressed             This Visit's Progress    Maintain optimal adherence to therapy       Patient is not on track and improving. Patient will work on increased adherence      Practice safe sex       Patient is on track. Patient will be evaluated at upcoming provider appointment to assess progress         Follow up:  6 months  Jennette Kettle Specialty Pharmacist

## 2023-04-09 ENCOUNTER — Other Ambulatory Visit: Payer: Self-pay

## 2023-04-11 ENCOUNTER — Other Ambulatory Visit: Payer: Self-pay

## 2023-04-14 ENCOUNTER — Other Ambulatory Visit (HOSPITAL_COMMUNITY): Payer: Self-pay

## 2023-04-15 ENCOUNTER — Encounter (HOSPITAL_COMMUNITY): Payer: Self-pay | Admitting: *Deleted

## 2023-04-15 ENCOUNTER — Other Ambulatory Visit: Payer: Self-pay

## 2023-04-15 ENCOUNTER — Emergency Department (HOSPITAL_COMMUNITY)
Admission: EM | Admit: 2023-04-15 | Discharge: 2023-04-16 | Discharge status: Against Medical Advice | Payer: BC Managed Care – PPO | Attending: Emergency Medicine | Admitting: Emergency Medicine

## 2023-04-15 DIAGNOSIS — S50311A Abrasion of right elbow, initial encounter: Secondary | ICD-10-CM | POA: Diagnosis not present

## 2023-04-15 DIAGNOSIS — Z5321 Procedure and treatment not carried out due to patient leaving prior to being seen by health care provider: Secondary | ICD-10-CM | POA: Diagnosis not present

## 2023-04-15 DIAGNOSIS — W01110A Fall on same level from slipping, tripping and stumbling with subsequent striking against sharp glass, initial encounter: Secondary | ICD-10-CM | POA: Diagnosis not present

## 2023-04-15 DIAGNOSIS — S61411A Laceration without foreign body of right hand, initial encounter: Secondary | ICD-10-CM | POA: Diagnosis not present

## 2023-04-15 DIAGNOSIS — S51811A Laceration without foreign body of right forearm, initial encounter: Secondary | ICD-10-CM | POA: Insufficient documentation

## 2023-04-15 NOTE — ED Triage Notes (Signed)
Pt states that around 2140, he slipped and  fell onto a wine glass. Abrasion to the right elbow. Small lac to the right forearm and two small lacs to the right hand. Bleeding controlled. New dressing applied in triage.

## 2023-04-16 ENCOUNTER — Other Ambulatory Visit: Payer: Self-pay

## 2023-04-16 ENCOUNTER — Emergency Department (HOSPITAL_BASED_OUTPATIENT_CLINIC_OR_DEPARTMENT_OTHER)
Admission: EM | Admit: 2023-04-16 | Discharge: 2023-04-16 | Disposition: A | Discharge status: Home / Self Care | Payer: Worker's Compensation | Attending: Emergency Medicine | Admitting: Emergency Medicine

## 2023-04-16 ENCOUNTER — Other Ambulatory Visit (HOSPITAL_COMMUNITY): Payer: Self-pay | Admitting: Pharmacy Technician

## 2023-04-16 ENCOUNTER — Other Ambulatory Visit (HOSPITAL_COMMUNITY): Payer: Self-pay

## 2023-04-16 DIAGNOSIS — Y990 Civilian activity done for income or pay: Secondary | ICD-10-CM | POA: Insufficient documentation

## 2023-04-16 DIAGNOSIS — S61411A Laceration without foreign body of right hand, initial encounter: Secondary | ICD-10-CM | POA: Diagnosis not present

## 2023-04-16 DIAGNOSIS — Z23 Encounter for immunization: Secondary | ICD-10-CM | POA: Diagnosis not present

## 2023-04-16 DIAGNOSIS — W01110A Fall on same level from slipping, tripping and stumbling with subsequent striking against sharp glass, initial encounter: Secondary | ICD-10-CM | POA: Insufficient documentation

## 2023-04-16 DIAGNOSIS — S41111A Laceration without foreign body of right upper arm, initial encounter: Secondary | ICD-10-CM

## 2023-04-16 DIAGNOSIS — S51811A Laceration without foreign body of right forearm, initial encounter: Secondary | ICD-10-CM | POA: Insufficient documentation

## 2023-04-16 DIAGNOSIS — S6991XA Unspecified injury of right wrist, hand and finger(s), initial encounter: Secondary | ICD-10-CM | POA: Diagnosis present

## 2023-04-16 MED ORDER — LIDOCAINE-EPINEPHRINE (PF) 2 %-1:200000 IJ SOLN
20.0000 mL | Freq: Once | INTRAMUSCULAR | Status: AC
  Administered 2023-04-16: 20 mL
  Filled 2023-04-16: qty 20

## 2023-04-16 MED ORDER — TETANUS-DIPHTH-ACELL PERTUSSIS 5-2.5-18.5 LF-MCG/0.5 IM SUSY
0.5000 mL | PREFILLED_SYRINGE | Freq: Once | INTRAMUSCULAR | Status: AC
  Administered 2023-04-16: 0.5 mL via INTRAMUSCULAR
  Filled 2023-04-16: qty 0.5

## 2023-04-16 NOTE — ED Provider Notes (Signed)
[place] EMERGENCY DEPARTMENT AT [facility] Provider Note   CSN: [id number] Arrival date & time: 04/16/23  0530     History  Chief Complaint  Patient presents with   Arm Injury    R    Mark Boone is a 33 y.o. male.  HPI     This is a 33 year old male who presents with laceration of the right hand and forearm.  Patient reports that he was at work when he slipped and fell after falling on a wine glass.  This happened around 9:30 PM last night.  He is not up-to-date on his tetanus.  Denies other injury.  Home Medications Prior to Admission medications   Medication Sig Start Date End Date Taking? Authorizing Provider  azithromycin (ZITHROMAX) 250 MG tablet Take 1 tablet (250 mg total) by mouth daily. Take first 2 tablets together, then 1 every day until finished. 01/15/21   Terrilee Files, MD  docusate sodium (COLACE) 100 MG capsule Take 1 capsule (100 mg total) by mouth every 12 (twelve) hours. 05/28/22   Zadie Rhine, MD  doxycycline (VIBRA-TABS) 100 MG tablet Take 2 tablets (200 mg) by mouth 24-72 hours after a sexual encounter 09/05/22   Kuppelweiser, Cassie L, RPH-CPP  emtricitabine-tenofovir AF (DESCOVY) 200-25 MG tablet Take 1 tablet by mouth daily. 03/20/23   Jennette Kettle, RPH-CPP  hydrocortisone (ANUSOL-HC) 2.5 % rectal cream Place 1 application rectally 2 (two) times daily. 11/21/20   Sponseller, Eugene Gavia, PA-C      Allergies    Patient has no known allergies.    Review of Systems   Review of Systems  Constitutional:  Negative for fever.  Skin:  Positive for wound. Negative for color change.  All other systems reviewed and are negative.   Physical Exam Updated Vital Signs BP (!) 154/101   Pulse (!) 52   Temp 98.1 F (36.7 C) (Oral)   Resp 16   Ht 1.829 m (6')   Wt 99.8 kg   SpO2 100%   BMI 29.84 kg/m  Physical Exam Vitals and nursing note reviewed.  Constitutional:      Appearance: He is well-developed. He is not ill-appearing.   HENT:     Head: Normocephalic and atraumatic.  Eyes:     Pupils: Pupils are equal, round, and reactive to light.  Cardiovascular:     Rate and Rhythm: Normal rate and regular rhythm.  Pulmonary:     Effort: Pulmonary effort is normal. No respiratory distress.  Musculoskeletal:       Arms:     Cervical back: Neck supple.     Comments: 3 approximately 1 cm laceration noted over the dorsum of the right hand and forearm, no active bleeding, slightly gaping, no palpable foreign bodies  Flexion and extension of all 5 digits intact 2+ radial pulse  Lymphadenopathy:     Cervical: No cervical adenopathy.  Skin:    General: Skin is warm and dry.  Neurological:     Mental Status: He is alert and oriented to person, place, and time.  Psychiatric:        Mood and Affect: Mood normal.     ED Results / Procedures / Treatments   Labs (all labs ordered are listed, but only abnormal results are displayed) Labs Reviewed - No data to display  EKG None  Radiology No results found.  Procedures .Laceration Repair  Date/Time: 04/16/2023 6:13 AM  Performed by: Shon Baton, MD Authorized by: Shon Baton, MD  Consent:    Consent obtained:  Verbal   Consent given by:  Patient   Risks discussed:  Infection and pain   Alternatives discussed:  No treatment Universal protocol:    Patient identity confirmed:  Verbally with patient Anesthesia:    Anesthesia method:  Local infiltration   Local anesthetic:  Lidocaine 2% WITH epi Laceration details:    Location:  Hand   Hand location:  R hand, dorsum   Length (cm):  1   Depth (mm):  2 Pre-procedure details:    Preparation:  Patient was prepped and draped in usual sterile fashion Exploration:    Limited defect created (wound extended): no     Wound exploration: wound explored through full range of motion and entire depth of wound visualized     Contaminated: no   Treatment:    Area cleansed with:  Povidone-iodine   Amount  of cleaning:  Standard   Irrigation solution:  Sterile saline   Visualized foreign bodies/material removed: no     Debridement:  None   Undermining:  None Skin repair:    Repair method:  Sutures   Suture size:  4-0   Suture material:  Nylon   Suture technique:  Simple interrupted   Number of sutures:  1 Approximation:    Approximation:  Close Repair type:    Repair type:  Simple Post-procedure details:    Dressing:  Antibiotic ointment   Procedure completion:  Tolerated    .Laceration Repair  Date/Time: 04/16/2023 6:13 AM  Performed by: Shon Baton, MD Authorized by: Shon Baton, MD   Consent:    Consent obtained:  Verbal   Consent given by:  Patient   Risks discussed:  Infection and pain   Alternatives discussed:  No treatment Universal protocol:    Patient identity confirmed:  Verbally with patient Anesthesia:    Anesthesia method:  Local infiltration   Local anesthetic:  Lidocaine 2% WITH epi Laceration details:    Location:  Hand   Hand location:  R forearm   Length (cm):  1   Depth (mm):  2 Pre-procedure details:    Preparation:  Patient was prepped and draped in usual sterile fashion Exploration:    Limited defect created (wound extended): no     Wound exploration: wound explored through full range of motion and entire depth of wound visualized     Contaminated: no   Treatment:    Area cleansed with:  Povidone-iodine   Amount of cleaning:  Standard   Irrigation solution:  Sterile saline   Visualized foreign bodies/material removed: no     Debridement:  None   Undermining:  None Skin repair:    Repair method:  Sutures   Suture size:  4-0   Suture material:  Nylon   Suture technique:  Simple interrupted   Number of sutures:  1 Approximation:    Approximation:  Close Repair type:    Repair type:  Simple Post-procedure details:    Dressing:  Antibiotic ointment   Procedure completion:  Tolerated  .Laceration Repair  Date/Time:  04/16/2023 6:13 AM  Performed by: Shon Baton, MD Authorized by: Shon Baton, MD   Consent:    Consent obtained:  Verbal   Consent given by:  Patient   Risks discussed:  Infection and pain   Alternatives discussed:  No treatment Universal protocol:    Patient identity confirmed:  Verbally with patient Anesthesia:    Anesthesia method:  Local infiltration  Local anesthetic:  Lidocaine 2% WITH epi Laceration details:    Location:  Hand   Hand location:  R forearm   Length (cm):  1   Depth (mm):  2 Pre-procedure details:    Preparation:  Patient was prepped and draped in usual sterile fashion Exploration:    Limited defect created (wound extended): no     Wound exploration: wound explored through full range of motion and entire depth of wound visualized     Contaminated: no   Treatment:    Area cleansed with:  Povidone-iodine   Amount of cleaning:  Standard   Irrigation solution:  Sterile saline   Visualized foreign bodies/material removed: no     Debridement:  None   Undermining:  None Skin repair:    Repair method:  Sutures   Suture size:  4-0   Suture material:  Nylon   Suture technique:  Simple interrupted   Number of sutures:  1 Approximation:    Approximation:  Close Repair type:    Repair type:  Simple Post-procedure details:    Dressing:  Antibiotic ointment   Procedure completion:  Tolerated   Medications Ordered in ED Medications  lidocaine-EPINEPHrine (XYLOCAINE W/EPI) 2 %-1:200000 (PF) injection 20 mL (has no administration in time range)  Tdap (BOOSTRIX) injection 0.5 mL (has no administration in time range)    ED Course/ Medical Decision Making/ A&P                                 Medical Decision Making Risk Prescription drug management.   This patient presents to the ED for concern of lacerations, this involves an extensive number of treatment options, and is a complaint that carries with it a high risk of complications and  morbidity.  I considered the following differential and admission for this acute, potentially life threatening condition.  The differential diagnosis includes laceration, foreign body, fracture  MDM:    This is a 33 year old male who presents with concern for lacerations to the right hand and forearm.  He is nontoxic.  Lacerations are small and slightly gaping.  Discussed that there is increased risk of infection as time goes on and closure of wounds.  He is approximately 9 out from injury.  Patient would like wounds more approximated.  Wounds were copiously irrigated.  No palpable foreign bodies.  Low concern for fracture.  Wounds were repaired each with 1 suture to well approximate.  We discussed return precautions.  Patient's tetanus was updated.  (Labs, imaging, consults)  Labs: I Ordered, and personally interpreted labs.  The pertinent results include: None  Imaging Studies ordered: I ordered imaging studies including none I independently visualized and interpreted imaging. I agree with the radiologist interpretation  Additional history obtained from chart review.  External records from outside source obtained and reviewed including prior evaluations  Cardiac Monitoring: The patient was not maintained on a cardiac monitor.  If on the cardiac monitor, I personally viewed and interpreted the cardiac monitored which showed an underlying rhythm of: N/A  Reevaluation: After the interventions noted above, I reevaluated the patient and found that they have :improved  Social Determinants of Health:  lives independently  Disposition: Discharge  Co morbidities that complicate the patient evaluation No past medical history on file.   Medicines Meds ordered this encounter  Medications   lidocaine-EPINEPHrine (XYLOCAINE W/EPI) 2 %-1:200000 (PF) injection 20 mL   Tdap (BOOSTRIX) injection 0.5 mL  I have reviewed the patients home medicines and have made adjustments as needed  Problem  List / ED Course: Problem List Items Addressed This Visit   None Visit Diagnoses       Laceration of multiple sites of right upper extremity, initial encounter    -  Primary                   Final Clinical Impression(s) / ED Diagnoses Final diagnoses:  Laceration of multiple sites of right upper extremity, initial encounter    Rx / DC Orders ED Discharge Orders     None         Shon Baton, MD 04/16/23 [phone]

## 2023-04-16 NOTE — ED Triage Notes (Signed)
Pt presents form [facility] hospital d/t concerns for laceration to right forearm and two lacerations right hand after falling on wine glass

## 2023-04-16 NOTE — ED Notes (Signed)
Eloped from waiting area and presented to another hospital.

## 2023-04-16 NOTE — Discharge Instructions (Signed)
You were seen today with lacerations to the right hand and arm.  Have stitches removed in approximately 10 days.  Monitor for redness or signs of infection.

## 2023-04-16 NOTE — ED Notes (Signed)
2 small lacerations to right hand and 1 small laceration to right forearm. All 3 lacerations irrigated with normal saline.

## 2023-04-16 NOTE — Progress Notes (Signed)
Specialty Pharmacy Refill Coordination Note  Mark Boone is a 33 y.o. male contacted today regarding refills of specialty medication(s) Emtricitabine-Tenofovir AF (Descovy)   Patient requested Delivery   Delivery date: 04/17/23   Verified address: Patient address [address]  [place]   Medication will be filled on 04/16/23.

## 2023-04-16 NOTE — ED Notes (Signed)
Antibiotic ointment and adhesive bandages applied to 3 laceration.

## 2023-04-27 ENCOUNTER — Emergency Department (HOSPITAL_BASED_OUTPATIENT_CLINIC_OR_DEPARTMENT_OTHER)
Admission: EM | Admit: 2023-04-27 | Discharge: 2023-04-27 | Disposition: A | Discharge status: Home / Self Care | Payer: Worker's Compensation | Attending: Emergency Medicine | Admitting: Emergency Medicine

## 2023-04-27 ENCOUNTER — Encounter (HOSPITAL_BASED_OUTPATIENT_CLINIC_OR_DEPARTMENT_OTHER): Payer: Self-pay | Admitting: Emergency Medicine

## 2023-04-27 ENCOUNTER — Other Ambulatory Visit: Payer: Self-pay

## 2023-04-27 DIAGNOSIS — Z4802 Encounter for removal of sutures: Secondary | ICD-10-CM | POA: Diagnosis present

## 2023-04-27 NOTE — ED Notes (Signed)
 Reviewed AVS/discharge instruction with patient. Time allotted for and all questions answered. Patient is agreeable for d/c and escorted to ed exit by staff.

## 2023-04-27 NOTE — Discharge Instructions (Addendum)
 Evaluation of your laceration sites after suture removal appear that they are all healing nicely.  Continue cleaning daily with soap and water .  If there are any concerns of infection like swelling, redness or pustulant discharge or bleeding or any other concern please return emergency department further evaluation.

## 2023-04-27 NOTE — ED Provider Notes (Signed)
 [place] EMERGENCY DEPARTMENT AT [facility] Provider Note   CSN: [id number] Arrival date & time: 04/27/23  2152     History  Chief Complaint  Patient presents with   Suture / Staple Removal   HPI Mark Boone is a 33 y.o. male presenting for suture removal.  States he had 3 sutures placed here [facility] on January 29 about his right forearm and hand.  States he has been cleaning his hand and forearm daily.  States the wounds seem to be healing nicely.  Denies any redness or swelling or pustulant oozing or bleeding from the sites.  Requesting removal.   Suture / Staple Removal       Home Medications Prior to Admission medications   Medication Sig Start Date End Date Taking? Authorizing Provider  azithromycin  (ZITHROMAX ) 250 MG tablet Take 1 tablet (250 mg total) by mouth daily. Take first 2 tablets together, then 1 every day until finished. 01/15/21   Towana Ozell BROCKS, MD  docusate sodium  (COLACE) 100 MG capsule Take 1 capsule (100 mg total) by mouth every 12 (twelve) hours. 05/28/22   Midge Golas, MD  doxycycline  (VIBRA -TABS) 100 MG tablet Take 2 tablets (200 mg) by mouth 24-72 hours after a sexual encounter 09/05/22   Kuppelweiser, Cassie L, RPH-CPP  emtricitabine -tenofovir  AF (DESCOVY ) 200-25 MG tablet Take 1 tablet by mouth daily. 03/20/23   Waddell Alan PARAS, RPH-CPP  hydrocortisone  (ANUSOL -HC) 2.5 % rectal cream Place 1 application rectally 2 (two) times daily. 11/21/20   Sponseller, Pleasant SAUNDERS, PA-C      Allergies    Patient has no known allergies.    Review of Systems   See HPI  Physical Exam Updated Vital Signs BP (!) 151/99   Pulse 80   Temp 98 F (36.7 C) (Temporal)   Resp 18   Ht 6' (1.829 m)   Wt 99.8 kg   SpO2 98%   BMI 29.84 kg/m  Physical Exam Constitutional:      Appearance: Normal appearance.  HENT:     Head: Normocephalic.     Nose: Nose normal.  Eyes:     Conjunctiva/sclera: Conjunctivae normal.  Pulmonary:     Effort:  Pulmonary effort is normal.  Musculoskeletal:     Comments: 3 small laceration sites with overlying sutures noted about the dorsum of the right hand and forearm.  All well approximated and endothelialized.  Not oozing or bleeding.  Not erythematous or edematous.  Not tender to touch.  Neurological:     Mental Status: He is alert.  Psychiatric:        Mood and Affect: Mood normal.     ED Results / Procedures / Treatments   Labs (all labs ordered are listed, but only abnormal results are displayed) Labs Reviewed - No data to display  EKG None  Radiology No results found.  Procedures Suture Removal  Date/Time: 04/27/2023 10:50 PM  Performed by: Lang Norleen POUR, PA-C Authorized by: Lang Norleen POUR, PA-C   Consent:    Consent obtained:  Verbal   Consent given by:  Patient   Risks discussed:  Bleeding and pain Universal protocol:    Procedure explained and questions answered to patient or proxy's satisfaction: yes     Relevant documents present and verified: yes     Patient identity confirmed:  Verbally with patient and arm band Location:    Location:  Upper extremity   Upper extremity location:  Arm (and right hand)   Arm location:  R lower  arm Procedure details:    Wound appearance:  No signs of infection   Number of sutures removed:  3   Number of staples removed:  0 Post-procedure details:    Post-removal:  No dressing applied   Procedure completion:  Tolerated well, no immediate complications     Medications Ordered in ED Medications - No data to display  ED Course/ Medical Decision Making/ A&P                                 Medical Decision Making  33 year old well-appearing male presenting for suture removal.  Exam notable for what appears to be 3 small laceration sites with overlying sutures that are overall healing very well.  Suture removal procedure went very well without bleeding and laceration sites remained well-approximated and well  endothelialized.  Advised to continue cleaning daily with soap and water .  Discussed return precautions if there were any concern for infection.  Discharged.         Final Clinical Impression(s) / ED Diagnoses Final diagnoses:  Visit for suture removal    Rx / DC Orders ED Discharge Orders     None         Lang Norleen POUR, PA-C 04/27/23 2254    Dreama Longs, MD 04/29/23 1050

## 2023-04-27 NOTE — ED Triage Notes (Signed)
 Patient requesting suture removal from right hand. Reports sutures placed January 29

## 2023-05-07 ENCOUNTER — Other Ambulatory Visit: Payer: Self-pay

## 2023-05-09 ENCOUNTER — Other Ambulatory Visit: Payer: Self-pay

## 2023-05-12 ENCOUNTER — Other Ambulatory Visit: Payer: Self-pay

## 2023-05-15 ENCOUNTER — Other Ambulatory Visit (HOSPITAL_COMMUNITY): Payer: Self-pay

## 2023-05-15 NOTE — Progress Notes (Signed)
 Specialty Pharmacy Refill Coordination Note  Mark Boone is a 33 y.o. male contacted today regarding refills of specialty medication(s) Emtricitabine-Tenofovir AF (Descovy)   Patient requested Pickup at [facility] [place] date: 05/15/23   Medication will be filled on 05/15/23.

## 2023-05-25 ENCOUNTER — Encounter (HOSPITAL_BASED_OUTPATIENT_CLINIC_OR_DEPARTMENT_OTHER): Payer: Self-pay | Admitting: *Deleted

## 2023-05-25 ENCOUNTER — Emergency Department (HOSPITAL_BASED_OUTPATIENT_CLINIC_OR_DEPARTMENT_OTHER)
Admission: EM | Admit: 2023-05-25 | Discharge: 2023-05-25 | Disposition: A | Discharge status: Home / Self Care | Attending: Emergency Medicine | Admitting: Emergency Medicine

## 2023-05-25 ENCOUNTER — Other Ambulatory Visit: Payer: Self-pay

## 2023-05-25 DIAGNOSIS — K645 Perianal venous thrombosis: Secondary | ICD-10-CM | POA: Insufficient documentation

## 2023-05-25 DIAGNOSIS — Z87891 Personal history of nicotine dependence: Secondary | ICD-10-CM | POA: Insufficient documentation

## 2023-05-25 HISTORY — DX: Unspecified hemorrhoids: K64.9

## 2023-05-25 LAB — RESP PANEL BY RT-PCR (RSV, FLU A&B, COVID)  RVPGX2
Influenza A by PCR: NEGATIVE
Influenza B by PCR: NEGATIVE
Resp Syncytial Virus by PCR: NEGATIVE
SARS Coronavirus 2 by RT PCR: NEGATIVE

## 2023-05-25 MED ORDER — LIDOCAINE HCL URETHRAL/MUCOSAL 2 % EX GEL
1.0000 | Freq: Once | CUTANEOUS | Status: AC
  Administered 2023-05-25: 1 via TOPICAL
  Filled 2023-05-25: qty 11

## 2023-05-25 MED ORDER — PHENYLEPHRINE IN HARD FAT 0.25 % RE SUPP: 1.0000 | Freq: Two times a day (BID) | RECTAL | 0 refills | Status: DC

## 2023-05-25 MED ORDER — POLYETHYLENE GLYCOL 3350 17 G PO PACK: 17.0000 g | PACK | Freq: Every day | ORAL | 1 refills | Status: DC

## 2023-05-25 MED ORDER — LIDOCAINE (ANORECTAL) 5 % EX CREA: TOPICAL_CREAM | CUTANEOUS | 0 refills | Status: DC

## 2023-05-25 MED ORDER — HYDROCORTISONE (PERIANAL) 2.5 % EX CREA: 1.0000 | TOPICAL_CREAM | Freq: Two times a day (BID) | CUTANEOUS | 0 refills | Status: DC

## 2023-05-25 NOTE — ED Provider Notes (Signed)
 [facility] Emergency Department Provider Note MRN:  [id number]  Arrival date & time: 05/25/23     Chief Complaint   Hemorrhoids   History of Present Illness   Mark Boone is a 33 y.o. year-old male with a history of hemorrhoids presenting to the ED with chief complaint of hemorrhoids.  Painful hemorrhoid over the past few days, feels a painful bump near the anus.  No fever, no other complaints.  Review of Systems  A thorough review of systems was obtained and all systems are negative except as noted in the HPI and PMH.   Patient's Health History    Past Medical History:  Diagnosis Date   Hemorrhoids     History reviewed. No pertinent surgical history.  History reviewed. No pertinent family history.  Social History   Socioeconomic History   Marital status: Single    Spouse name: Not on file   Number of children: Not on file   Years of education: Not on file   Highest education level: Not on file  Occupational History   Not on file  Tobacco Use   Smoking status: Former    Types: Cigarettes   Smokeless tobacco: Never   Tobacco comments:    3 cigarettes/day  Vaping Use   Vaping status: Some Days  Substance and Sexual Activity   Alcohol use: Yes    Comment: Socially    Drug use: No   Sexual activity: Yes  Other Topics Concern   Not on file  Social History Narrative   Not on file   Social Drivers of Health   Financial Resource Strain: Not on file  Food Insecurity: Not on file  Transportation Needs: Not on file  Physical Activity: Not on file  Stress: Not on file  Social Connections: Unknown (07/31/2021)   Received from [facility], [facility]   Social Network    Social Network: Not on file  Intimate Partner Violence: Unknown (06/22/2021)   Received from [employer], [facility]   HITS    Physically Hurt: Not on file    Insult or Talk Down To: Not on file    Threaten Physical Harm: Not on file    Scream or Curse:  Not on file     Physical Exam   Vitals:   05/25/23 0038  BP: (!) 154/119  Pulse: (!) 103  Resp: 16  Temp: 98.2 F (36.8 C)  SpO2: 99%    CONSTITUTIONAL: Well-appearing, NAD NEURO/PSYCH:  Alert and oriented x 3, no focal deficits EYES:  eyes equal and reactive ENT/NECK:  no LAD, no JVD CARDIO: Regular rate, well-perfused, normal S1 and S2 PULM:  CTAB no wheezing or rhonchi GI/GU:  non-distended, non-tender MSK/SPINE:  No gross deformities, no edema SKIN:  no rash, atraumatic   *Additional and/or pertinent findings included in MDM below  Diagnostic and Interventional Summary    EKG Interpretation Date/Time:    Ventricular Rate:    PR Interval:    QRS Duration:    QT Interval:    QTC Calculation:   R Axis:      Text Interpretation:         Labs Reviewed  RESP PANEL BY RT-PCR (RSV, FLU A&B, COVID)  [id number]    No orders to display    Medications  lidocaine (XYLOCAINE) 2 % jelly 1 Application (1 Application Topical Given 05/25/23 0116)     Procedures  /  Critical Care Procedures  ED Course and Medical Decision Making  Initial  Impression and Ddx Appears to have a thrombosed external hemorrhoid at the 9 o'clock position on exam.  No signs of abscess or infection.  Past medical/surgical history that increases complexity of ED encounter: None  Interpretation of Diagnostics Laboratory and/or imaging options to aid in the diagnosis/care of the patient were considered.  After careful history and physical examination, it was determined that there was no indication for diagnostics at this time.  Patient Reassessment and Ultimate Disposition/Management     With no signs of emergent process patient is appropriate for medical management at home with return precautions.  Patient management required discussion with the following services or consulting groups:  None  Complexity of Problems Addressed Acute complicated illness or Injury  Additional Data Reviewed and  Analyzed Further history obtained from: None  Additional Factors Impacting ED Encounter Risk Prescriptions  Elmer Sow. Pilar Plate, MD [facility] Emergency Medicine [facility] [email or web address]  Final Clinical Impressions(s) / ED Diagnoses     ICD-10-CM   1. External hemorrhoid, thrombosed  K64.5       ED Discharge Orders          Ordered    Lidocaine, Anorectal, (HEMORRHOIDAL RELIEF) 5 % CREA        05/25/23 0128    phenylephrine (,USE FOR PREPARATION-H,) 0.25 % suppository  2 times daily        05/25/23 0128    hydrocortisone (ANUSOL-HC) 2.5 % rectal cream  2 times daily        05/25/23 0128             Discharge Instructions Discussed with and Provided to Patient:    Discharge Instructions      You were evaluated in the Emergency Department and after careful evaluation, we did not find any emergent condition requiring admission or further testing in the hospital.  Your exam/testing today is overall reassuring.  Symptoms likely due to a thrombosed external hemorrhoid.  Recommend use of the hydrocortisone cream and phenylephrine suppository to help shrink the hemorrhoid.  Can use the lidocaine cream as needed for pain.  Also recommend sitz bath's as we discussed.  Can follow-up with the surgeons if not getting better over the next few days.  Please return to the Emergency Department if you experience any worsening of your condition.   Thank you for allowing [name] to be a part of your care.      Sabas Sous, MD 05/25/23 [phone]

## 2023-05-25 NOTE — Discharge Instructions (Addendum)
 You were evaluated in the Emergency Department and after careful evaluation, we did not find any emergent condition requiring admission or further testing in the hospital.  Your exam/testing today is overall reassuring.  Symptoms likely due to a thrombosed external hemorrhoid.  Use the MiraLAX once or twice daily to soften your stool.  Recommend use of the hydrocortisone cream and phenylephrine suppository to help shrink the hemorrhoid.  Can use the lidocaine cream as needed for pain.  Also recommend sitz bath's as we discussed.  Can follow-up with the surgeons if not getting better over the next few days.  Please return to the Emergency Department if you experience any worsening of your condition.   Thank you for allowing [name] to be a part of your care.

## 2023-05-25 NOTE — ED Triage Notes (Signed)
 Pt is here for hemorroids x1 week.  Pt reports painful external hemorrhoid about the size of the tip of a finger.  Pt also notes that loss of sense of smell but denies any URI symptoms with this.

## 2023-06-03 ENCOUNTER — Other Ambulatory Visit: Payer: Self-pay

## 2023-06-06 ENCOUNTER — Other Ambulatory Visit: Payer: Self-pay

## 2023-06-12 ENCOUNTER — Other Ambulatory Visit: Payer: Self-pay

## 2023-06-13 ENCOUNTER — Other Ambulatory Visit (HOSPITAL_COMMUNITY): Payer: Self-pay

## 2023-06-13 ENCOUNTER — Other Ambulatory Visit: Payer: Self-pay

## 2023-06-13 ENCOUNTER — Ambulatory Visit (INDEPENDENT_AMBULATORY_CARE_PROVIDER_SITE_OTHER): Payer: BC Managed Care – PPO | Admitting: Pharmacist

## 2023-06-13 DIAGNOSIS — Z113 Encounter for screening for infections with a predominantly sexual mode of transmission: Secondary | ICD-10-CM | POA: Diagnosis not present

## 2023-06-13 DIAGNOSIS — Z79899 Other long term (current) drug therapy: Secondary | ICD-10-CM

## 2023-06-13 MED ORDER — DESCOVY 200-25 MG PO TABS
1.0000 | ORAL_TABLET | Freq: Every day | ORAL | 2 refills | Status: DC
  Filled 2023-06-13: qty 30, 30d supply, fill #0
  Filled 2023-07-21: qty 30, 30d supply, fill #1
  Filled 2023-08-13: qty 30, 30d supply, fill #2

## 2023-06-13 NOTE — Patient Instructions (Signed)
 PrEP injection = Apretude (cabotegravir)   - Apretude is one intramuscular injection in the gluteal muscle for each visit - 1st two injections given one month apart as a loading dose - Then next injections are given every two months - Showing up to injection appointments is very important  - If appointments are missed, protection will be minimal and the risk of acquiring HIV becomes much higher. - Possible side effects associated with the injections include injection site pain, which is usually mild to moderate in nature, injection site nodules, and injection site reactions.    Mark Boone (call me at [phone] or send me MyChart message)

## 2023-06-13 NOTE — Progress Notes (Signed)
 Specialty Pharmacy Refill Coordination Note  Mark Boone is a 33 y.o. male contacted today regarding refills of specialty medication(s) Emtricitabine-Tenofovir AF (Descovy)   Patient requested Delivery   Delivery date: 06/17/23   Verified address: [address] [place] [address]   Medication will be filled on 06/16/23.

## 2023-06-13 NOTE — Progress Notes (Signed)
 Date:  06/13/2023   HPI: Mark Boone is a 33 y.o. male who presents to the [facility] pharmacy clinic for HIV PrEP follow-up.  Insured   [x]    Uninsured  []    Patient Active Problem List   Diagnosis Date Noted   High risk sexual behavior 09/25/2020   On pre-exposure prophylaxis for HIV 09/25/2020    Patient's Medications  New Prescriptions   No medications on file  Previous Medications   AZITHROMYCIN (ZITHROMAX) 250 MG TABLET    Take 1 tablet (250 mg total) by mouth daily. Take first 2 tablets together, then 1 every day until finished.   DOCUSATE SODIUM (COLACE) 100 MG CAPSULE    Take 1 capsule (100 mg total) by mouth every 12 (twelve) hours.   DOXYCYCLINE (VIBRA-TABS) 100 MG TABLET    Take 2 tablets (200 mg) by mouth 24-72 hours after a sexual encounter   HYDROCORTISONE (ANUSOL-HC) 2.5 % RECTAL CREAM    Place 1 Application rectally 2 (two) times daily.   LIDOCAINE, ANORECTAL, (HEMORRHOIDAL RELIEF) 5 % CREA    Apply to painful hemorrhoid every 4 hours as needed.   PHENYLEPHRINE (,USE FOR PREPARATION-H,) 0.25 % SUPPOSITORY    Place 1 suppository rectally 2 (two) times daily.   POLYETHYLENE GLYCOL (MIRALAX) 17 G PACKET    Take 17 g by mouth daily.  Modified Medications   Modified Medication Previous Medication   EMTRICITABINE-TENOFOVIR AF (DESCOVY) 200-25 MG TABLET emtricitabine-tenofovir AF (DESCOVY) 200-25 MG tablet      Take 1 tablet by mouth daily.    Take 1 tablet by mouth daily.  Discontinued Medications   No medications on file    Allergies: No Known Allergies  Past Medical History: Past Medical History:  Diagnosis Date   Hemorrhoids     Social History: Social History   Socioeconomic History   Marital status: Single    Spouse name: Not on file   Number of children: Not on file   Years of education: Not on file   Highest education level: Not on file  Occupational History   Not on file  Tobacco Use   Smoking status: Former    Types: Cigarettes   Smokeless  tobacco: Never   Tobacco comments:    3 cigarettes/day  Vaping Use   Vaping status: Some Days  Substance and Sexual Activity   Alcohol use: Yes    Comment: Socially    Drug use: No   Sexual activity: Yes  Other Topics Concern   Not on file  Social History Narrative   Not on file   Social Drivers of Health   Financial Resource Strain: Not on file  Food Insecurity: Not on file  Transportation Needs: Not on file  Physical Activity: Not on file  Stress: Not on file  Social Connections: Unknown (07/31/2021)   Received from [facility], [facility]   Social Network    Social Network: Not on file       08/28/2021   11:51 AM  CH[facility] HIV PREP FLOWSHEET RESULTS  Insurance Status Uninsured  Gender at birth Male  Gender identity cis-Male  Risk for HIV Hx of nPEP use  Sex Partners Men only  # sex partners past 3-6 mos 1  Sex activity preferences Insertive and receptive;Oral  Condom use Yes  % condom use 100  Treated for STI? No  HIV symptoms? None    Labs:  SCr: Lab Results  Component Value Date   CREATININE 0.76 05/28/2022   CREATININE 0.95  02/28/2022   CREATININE 0.91 01/15/2021   CREATININE 0.91 09/25/2020   CREATININE 1.11 05/17/2020   HIV Lab Results  Component Value Date   HIV NON-REACTIVE 03/20/2023   HIV NON-REACTIVE 12/11/2022   HIV NON-REACTIVE 09/04/2022   HIV NON-REACTIVE 05/22/2022   HIV NON-REACTIVE 02/28/2022   Hepatitis B Lab Results  Component Value Date   HEPBSAB REACTIVE (A) 09/25/2020   HEPBSAG NON-REACTIVE 09/25/2020   Hepatitis C No results found for: "HEPCAB", "HCVRNAPCRQN" Hepatitis A No results found for: "HAV" RPR and STI Lab Results  Component Value Date   LABRPR NON-REACTIVE 03/20/2023   LABRPR NON-REACTIVE 09/04/2022   LABRPR NON-REACTIVE 05/22/2022   LABRPR NON-REACTIVE 02/28/2022   LABRPR NON-REACTIVE 11/14/2021    STI Results GC CT  12/11/2022 11:23 AM Negative    Negative    Negative  Negative    Negative     Negative   09/04/2022 11:29 AM Positive    Negative    Negative  Negative    Negative    Negative   05/22/2022 10:45 AM Negative    Negative    Negative  Negative    Negative    Negative   02/28/2022  3:29 PM Negative    Negative    Negative  Negative    Negative    Negative   11/14/2021  2:13 PM Negative    Negative    Negative  Negative    Negative    Negative   08/28/2021 11:53 AM Negative    Negative    Negative  Negative    Negative    Negative   09/25/2020  2:44 PM Negative    Negative    Negative  Negative    Negative    Negative   07/05/2020  9:04 PM Negative  Negative   12/23/2019  2:07 PM Negative  Negative   05/25/2019 12:00 AM Negative  Negative   05/30/2017 12:00 AM Negative  Negative   03/22/2015 12:00 AM Negative  Negative     Assessment: Hendrix presents to clinic today for PrEP follow-up. Denies any missed doses or adverse effects.  Screened patient for acute HIV symptoms such as fatigue, muscle aches, rash, sore throat, lymphadenopathy, headache, night sweats, nausea/vomiting/diarrhea, and fever.  Still has ~2 tablets left over, so he will run out of tablets over the weekend. No new issues with [facility] delivery. Will check HIV antibody and send in refills today given low risk for HIV acquisition. Requested 1 week of samples so that he can continue daily dosing over the weekend. Will schedule future visits slightly earlier to prevent this issue next time.   Last STI screening was in January and was negative. Remains exclusive with one sexual partner; will check routine RPR and urine/oral/rectal cytologies today.  He declines all vaccines; eligible for HAV, flu, COVID, and HPV vaccines.   He inquired about Apretude today. Will review more and get back to me if interested. Counseled that Apretude is one intramuscular injection in the gluteal muscle for each visit. Explained that the second injection is 30 days after the initial injection then every 2 months  thereafter. Discussed follow up appointments moving forward. Explained that showing up to injection appointments is very important and warned that if appointments are missed, protection will be minimal and the risk of acquiring HIV becomes much higher. Counseled on possible side effects associated with the injections such as injection site pain, which is usually mild to moderate in nature, injection site nodules, and injection site  reactions. Asked to call the clinic or send me a mychart message if they experience any issues. Advised that they can take Motrin or Tylenol for injection site pain if needed. They may also pre-treat with Motrin or Tylenol about 30-45 minutes before scheduled appointments.   Plan: - Check HIV antibody, RPR, and urine/oral/rectal cytologies - Refill Descovy x 3 months - Follow-up with me on 6/20   Margarite Gouge, PharmD, CPP, BCIDP, AAHIVP Clinical Pharmacist Practitioner Infectious Diseases Clinical Pharmacist [facility] 06/13/2023, 11:38 AM

## 2023-06-14 LAB — C. TRACHOMATIS/N. GONORRHOEAE RNA
C. trachomatis RNA, TMA: NOT DETECTED
N. gonorrhoeae RNA, TMA: NOT DETECTED

## 2023-06-14 LAB — RPR: RPR Ser Ql: NONREACTIVE

## 2023-06-14 LAB — CT/NG RNA, TMA RECTAL
Chlamydia Trachomatis RNA: NOT DETECTED
Neisseria Gonorrhoeae RNA: NOT DETECTED

## 2023-06-14 LAB — HIV ANTIBODY (ROUTINE TESTING W REFLEX): HIV 1&2 Ab, 4th Generation: NONREACTIVE

## 2023-06-14 LAB — GC/CHLAMYDIA PROBE, AMP (THROAT)
Chlamydia trachomatis RNA: NOT DETECTED
Neisseria gonorrhoeae RNA: NOT DETECTED

## 2023-06-16 ENCOUNTER — Other Ambulatory Visit: Payer: Self-pay

## 2023-06-20 ENCOUNTER — Other Ambulatory Visit: Payer: Self-pay | Admitting: Pharmacist

## 2023-06-20 MED ORDER — DESCOVY 200-25 MG PO TABS: 1.0000 | ORAL_TABLET | Freq: Every day | ORAL | Status: AC

## 2023-06-20 NOTE — Progress Notes (Signed)
 Medication Samples have been provided to the patient.  Drug name: Descovy     Strength: 200/25 mg    Qty: 7 tablets (1 bottles) LOT: [id number]   Exp.Date: 3/27  Samples requested by Marchelle Folks as patient ran out of medication.  Dosing instructions: Take one tablet by mouth once daily.   The patient has been instructed regarding the correct time, dose, and frequency of taking this medication, including desired effects and most common side effects.   Margarite Gouge, PharmD, CPP, BCIDP, AAHIVP Clinical Pharmacist Practitioner Infectious Diseases Clinical Pharmacist [facility]

## 2023-07-01 ENCOUNTER — Other Ambulatory Visit: Payer: Self-pay

## 2023-07-03 ENCOUNTER — Other Ambulatory Visit: Payer: Self-pay

## 2023-07-07 ENCOUNTER — Other Ambulatory Visit: Payer: Self-pay

## 2023-07-09 ENCOUNTER — Other Ambulatory Visit: Payer: Self-pay

## 2023-07-18 ENCOUNTER — Other Ambulatory Visit (HOSPITAL_COMMUNITY): Payer: Self-pay

## 2023-07-21 ENCOUNTER — Other Ambulatory Visit: Payer: Self-pay

## 2023-07-21 NOTE — Progress Notes (Signed)
 Specialty Pharmacy Refill Coordination Note  Mark Boone is a 33 y.o. male contacted today regarding refills of specialty medication(s) Emtricitabine -Tenofovir  AF (Descovy )   Patient requested Delivery   Delivery date: 07/23/23   Verified address: [address] [place] [address]   Medication will be filled on 07/22/23.

## 2023-07-22 ENCOUNTER — Other Ambulatory Visit: Payer: Self-pay

## 2023-08-12 ENCOUNTER — Other Ambulatory Visit: Payer: Self-pay

## 2023-08-13 ENCOUNTER — Other Ambulatory Visit: Payer: Self-pay

## 2023-08-13 NOTE — Progress Notes (Signed)
 Specialty Pharmacy Refill Coordination Note  Mark Boone is a 33 y.o. male contacted today regarding refills of specialty medication(s) Emtricitabine -Tenofovir  AF (Descovy )   Patient requested Delivery   Delivery date: 08/15/23   Verified address: [address] [place] [address]   Medication will be filled on 08/14/23.

## 2023-08-14 ENCOUNTER — Other Ambulatory Visit: Payer: Self-pay

## 2023-08-18 ENCOUNTER — Ambulatory Visit: Payer: Self-pay

## 2023-08-18 NOTE — Telephone Encounter (Signed)
 Copied from CRM [id number]. Topic: Clinical - Red Word Triage >> Aug 18, 2023  2:24 PM Mark Boone wrote: Red Word that prompted transfer to Nurse Triage: Patient is calling to report hemorrhoid, thrombosed with bleeding. Bleeding every other wipe.   Chief Complaint: Hemorrhoid Symptoms: intermittent bleeding Frequency: intermittnet Pertinent Negatives: Patient denies constipation or diarrhea Disposition: [] ED /[] Urgent Care (no appt availability in office) / [x] Appointment(In office/virtual)/ []  [place] [facility]/ [] Home Care/ [] Refused Recommended Disposition /[] [place] [facility]/ []  Follow-up with PCP Additional Notes: Patient endorses hemorrhoids for several months. Seen in ED on 3/9 and prescribed hydrocortisone  cream and stool softener. Pt says the medications is helping but he's needing to establish primary care for ongoing care. New Patient appt for 6/6 at [facility].   Reason for Disposition  Rectal bleeding is a chronic symptom (recurrent or ongoing AND present > 4 weeks)  Answer Assessment - Initial Assessment Questions 1. APPEARANCE of BLOOD: "What color is it?" "Is it passed separately, on the surface of the stool, or mixed in with the stool?"      Mixed in with stool  2. AMOUNT: "How much blood was passed?"      *No Answer* 3. FREQUENCY: "How many times has blood been passed with the stools?"      Bleeding with every other  4. ONSET: "When was the blood first seen in the stools?" (Days or weeks)      Started since valentines day, seen in ED in March, prescribed hydrocortisone  and stools softener.   5. DIARRHEA: "Is there also some diarrhea?" If Yes, ask: "How many diarrhea stools in the past 24 hours?"      No  6. CONSTIPATION: "Do you have constipation?" If Yes, ask: "How bad is it?"     No  7. RECURRENT SYMPTOMS: "Have you had blood in your stools before?" If Yes, ask: "When was the last time?" and "What happened that time?"      History of  hemorrhoid  8. BLOOD THINNERS: "Do you take any blood thinners?" (e.g., Coumadin/warfarin, Pradaxa/dabigatran, aspirin)     No  9. OTHER SYMPTOMS: "Do you have any other symptoms?"  (e.g., abdomen pain, vomiting, dizziness, fever)     *No Answer* 10. PREGNANCY: "Is there any chance you are pregnant?" "When was your last menstrual period?"       *No Answer*  Protocols used: Rectal Bleeding-A-AH

## 2023-08-22 ENCOUNTER — Encounter: Payer: Self-pay | Admitting: Student in an Organized Health Care Education/Training Program

## 2023-08-22 ENCOUNTER — Ambulatory Visit (HOSPITAL_BASED_OUTPATIENT_CLINIC_OR_DEPARTMENT_OTHER)
Admission: RE | Admit: 2023-08-22 | Discharge: 2023-08-22 | Disposition: A | Discharge status: Home / Self Care | Source: Ambulatory Visit | Attending: Student in an Organized Health Care Education/Training Program | Admitting: Student in an Organized Health Care Education/Training Program

## 2023-08-22 ENCOUNTER — Ambulatory Visit: Admitting: Student in an Organized Health Care Education/Training Program

## 2023-08-22 VITALS — BP 135/76 | HR 75 | Ht 71.65 in | Wt 236.0 lb

## 2023-08-22 DIAGNOSIS — Z79899 Other long term (current) drug therapy: Secondary | ICD-10-CM | POA: Diagnosis not present

## 2023-08-22 DIAGNOSIS — K644 Residual hemorrhoidal skin tags: Secondary | ICD-10-CM | POA: Diagnosis not present

## 2023-08-22 DIAGNOSIS — R0600 Dyspnea, unspecified: Secondary | ICD-10-CM | POA: Insufficient documentation

## 2023-08-22 DIAGNOSIS — R002 Palpitations: Secondary | ICD-10-CM | POA: Diagnosis not present

## 2023-08-22 DIAGNOSIS — K602 Anal fissure, unspecified: Secondary | ICD-10-CM | POA: Diagnosis not present

## 2023-08-22 MED ORDER — NITROGLYCERIN 0.4 % RE OINT: TOPICAL_OINTMENT | RECTAL | 0 refills | Status: DC

## 2023-08-22 NOTE — Progress Notes (Signed)
 New Patient Office Visit  Subjective    Patient ID: Mark Boone, male    DOB: 1990/04/05  Age: 33 y.o. MRN: [id number]  CC:  Chief Complaint  Patient presents with   Establish Care    Pt has no concerns    HPI  Mark Boone presents to establish care  33 year old person here for evaluation of anal hemorrhoids.  He reports a sharp stinging type pain in his rectum that occurs almost on a daily basis.  Lasts for several hours.  He was at emergency department and diagnosed with hemorrhoids.  He has been treating this with sitz bath's and topical hydrocortisone .  Symptoms are not improving.  He does have receptive anal intercourse with other men, but has not had recent intercourse in a few months since the discomfort in his rectum started.  He does feel some masses around his rectum, and has had small amounts of bleeding on the toilet paper when he wipes.  He reports soft bowel movements, occasionally has to strain.   Other issue he reports is a occasional sensation of palpitations in his chest.  Sometimes associated with anxiety and a little depressed mood.  No chest pain.  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  He is also going to school for masters degree, wants to go to business.  He has lived in [place] for about 7 years now.  Smokes about 3 cigarettes daily, and has about 1 drink nightly.   Outpatient Encounter Medications as of 08/22/2023  Medication Sig   Nitroglycerin 0.4 % OINT Apply a small amount to the rectum twice daily for up to 8 weeks   doxycycline  (VIBRA -TABS) 100 MG tablet Take 2 tablets (200 mg) by mouth 24-72 hours after a sexual encounter (Patient not taking: Reported on 08/22/2023)   emtricitabine -tenofovir  AF (DESCOVY ) 200-25 MG tablet Take 1 tablet by mouth daily. (Patient not taking: Reported on 08/22/2023)   polyethylene glycol (MIRALAX ) 17 g packet Take 17 g by mouth daily. (Patient not taking: Reported on  08/22/2023)   [DISCONTINUED] hydrocortisone  (ANUSOL -HC) 2.5 % rectal cream Place 1 Application rectally 2 (two) times daily. (Patient not taking: Reported on 08/22/2023)   [DISCONTINUED] Lidocaine , Anorectal, (HEMORRHOIDAL RELIEF) 5 % CREA Apply to painful hemorrhoid every 4 hours as needed. (Patient not taking: Reported on 08/22/2023)   [DISCONTINUED] phenylephrine  (,USE FOR PREPARATION-H,) 0.25 % suppository Place 1 suppository rectally 2 (two) times daily. (Patient not taking: Reported on 08/22/2023)   No facility-administered encounter medications on file as of 08/22/2023.    Past Medical History:  Diagnosis Date   Hemorrhoids     History reviewed. No pertinent surgical history.  Family History  Problem Relation Age of Onset   Diabetes type II Mother    Hypertension Mother    Sarcoidosis Father    Sarcoidosis Brother     Social History   Socioeconomic History   Marital status: Single    Spouse name: Not on file   Number of children: Not on file   Years of education: Not on file   Highest education level: Not on file  Occupational History   Not on file  Tobacco Use   Smoking status: Former    Types: Cigarettes   Smokeless tobacco: Never   Tobacco comments:    3 cigarettes/day  Vaping Use   Vaping status: Some Days  Substance and Sexual Activity   Alcohol use: Yes    Comment: Socially  Drug use: No   Sexual activity: Yes  Other Topics Concern   Not on file  Social History Narrative   Not on file   Social Drivers of Health   Financial Resource Strain: Not on file  Food Insecurity: Not on file  Transportation Needs: Not on file  Physical Activity: Not on file  Stress: Not on file  Social Connections: Unknown (07/31/2021)   Received from [facility], [facility]   Social Network    Social Network: Not on file  Intimate Partner Violence: Unknown (06/22/2021)   Received from [facility], [facility]   HITS    Physically Hurt: Not on file    Insult or Talk  Down To: Not on file    Threaten Physical Harm: Not on file    Scream or Curse: Not on file        Objective    BP 135/76   Pulse 75   Ht 5' 11.65" (1.82 m)   Wt 236 lb (107 kg)   SpO2 98%   BMI 32.32 kg/m   Physical Exam  Gen: Well-appearing Eyes: Normal Ears: Normal tympanic membranes bilaterally Neck: Normal thyroid, no adenopathy or nodules Heart: Regular, no murmur Lungs: Unlabored, clear throughout Abd: Soft, nontender, nondistended Rectum: External rectum has small hemorrhoidal skin tags superiorly which are soft and compressible, do not feel thrombosed, below that at that superior anus there is a pale-appearing moderate-sized anal fissure which is very tender to palpation Ext: Warm, no edema Psych: Appropriate mood affect, not anxious or depressed appearing Neuro: Alert, conversational, full strength upper and lower extremities  EKG: Completed because of a sensation of palpitations.  EKG is normal sinus rhythm, normal axis, normal intervals, nonspecific T wave inversions in V3 and V4, otherwise normal.     Assessment & Plan:   Problem List Items Addressed This Visit       Medium    On pre-exposure prophylaxis for HIV (Chronic)   Chronic and stable.  Patient uses Descovy  for preexposure prophylaxis.  Has routine follow-up and testing through [facility].        Unprioritized   Palpitations - Primary   Acute issue over the last few months.  Pretty mild symptom burden.  EKG today is reassuring with normal QT interval, normal sinus rhythm.  Will check a TSH today.  I wonder if this is related to mood issue.  Follow-up with me in 4 weeks, if symptom burden increases can talk about doing an ambulatory monitor.      Relevant Orders   EKG 12-Lead   TSH   Anal fissure   Acute issue would last few weeks.  Exam today is most consistent to me with an anal fissure as he has significant tenderness to palpation.  I do see an anal fissure at the superior anus.  Could be  coming from constipation or receptive intercourse.  Will treat this with topical nitroglycerin twice daily for 4 weeks.  Follow-up with me in 4 weeks and we will reevaluate the fissure.  We talked about using MiraLAX  for stool softening.      Relevant Medications   Nitroglycerin 0.4 % OINT   Hemorrhoidal skin tags   Exam today shows a small hemorrhoidal skin tag at the superior anus.  Is associated with an anal fissure.  The skin tag is soft and compressible so I do not think it is thrombosed, I do not think it is the cause of his discomfort.  Patient would like to have  it removed.  Will refer to GI to consider colonoscopy and treatment options.      Relevant Orders   Ambulatory referral to Gastroenterology   Dyspnea   Patient reports a mild symptom burden of dyspnea over the last few weeks that sometimes limits his working.  His brother recently passed away from disseminated sarcoidosis and so he and family are worried about the risk of a genetic clustering.  Exam is reassuring.  Because of the dyspnea we will start with a chest x-ray to evaluate for opacities.      Relevant Orders   DG Chest 2 View    Return in about 4 weeks (around 09/19/2023).   Ether Hercules, MD

## 2023-08-22 NOTE — Patient Instructions (Signed)
 Instructions for Using Topical Nitroglycerin Ointment for Anal Fissure   What is this medication for? Topical nitroglycerin ointment is used to help heal an anal fissure (a small tear in the lining of the anus) and to reduce pain. It works by relaxing the muscles around the anus and increasing blood flow to help healing.   How to use the ointment:  Wash your hands before and after applying the ointment.  Squeeze out the amount prescribed by your doctor (usually a 1-inch strip of 0.4% ointment, or a "pea-sized" amount if using a lower concentration).   Apply the ointment to the tip of a gloved or lubricated finger.  Gently insert your finger just into the anal canal (up to the first knuckle) and massage the ointment into the area. This helps the medicine reach the muscle that needs to relax.   Apply the ointment every 12 hours (twice a day), or as directed by your doctor. Do not use more than prescribed.   Treatment is usually continued for up to 3 weeks, but your doctor may adjust the duration based on your symptoms.   What to expect:  Pain relief may begin within a few days, but it can take several weeks for the fissure to heal.   Continue using the ointment as prescribed, even if you start to feel better.   Possible side effects:  The most common side effect is headache. This is usually mild but can be bothersome. If you get a headache, try resting in a dark room and drinking water . If headaches are severe or do not go away, contact your doctor.   Other possible side effects include dizziness or lightheadedness, especially when standing up quickly. Move slowly from sitting or lying to standing.  Rarely, skin irritation may occur at the application site.   Other helpful tips:  Keep your stools soft by drinking plenty of water  and eating a high-fiber diet (fruits, vegetables, whole grains). This helps prevent straining and allows the fissure to heal.   Taking warm sitz baths (sitting in  a few inches of warm water ) for 10-15 minutes several times a day can help relieve pain and relax the anal muscles.   Avoid using the ointment near open wounds or broken skin other than the fissure area.   When to call your doctor:  If you have severe or persistent headaches, chest pain, fainting, or if your symptoms do not improve after several weeks.  If you experience any allergic reactions such as rash, itching, or swelling.  Special notes:  Do not use this medication with medications for erectile dysfunction (such as sildenafil/Viagra) or other nitrates, as this can cause dangerous drops in blood pressure.  Store the ointment at room temperature and keep it out of reach of children.

## 2023-08-22 NOTE — Assessment & Plan Note (Signed)
 Patient reports a mild symptom burden of dyspnea over the last few weeks that sometimes limits his working.  His brother recently passed away from disseminated sarcoidosis and so he and family are worried about the risk of a genetic clustering.  Exam is reassuring.  Because of the dyspnea we will start with a chest x-ray to evaluate for opacities.

## 2023-08-22 NOTE — Assessment & Plan Note (Signed)
 Acute issue would last few weeks.  Exam today is most consistent to me with an anal fissure as he has significant tenderness to palpation.  I do see an anal fissure at the superior anus.  Could be coming from constipation or receptive intercourse.  Will treat this with topical nitroglycerin twice daily for 4 weeks.  Follow-up with me in 4 weeks and we will reevaluate the fissure.  We talked about using MiraLAX  for stool softening.

## 2023-08-22 NOTE — Assessment & Plan Note (Signed)
 Chronic and stable.  Patient uses Descovy  for preexposure prophylaxis.  Has routine follow-up and testing through [facility].

## 2023-08-22 NOTE — Assessment & Plan Note (Signed)
 Acute issue over the last few months.  Pretty mild symptom burden.  EKG today is reassuring with normal QT interval, normal sinus rhythm.  Will check a TSH today.  I wonder if this is related to mood issue.  Follow-up with me in 4 weeks, if symptom burden increases can talk about doing an ambulatory monitor.

## 2023-08-22 NOTE — Assessment & Plan Note (Signed)
 Exam today shows a small hemorrhoidal skin tag at the superior anus.  Is associated with an anal fissure.  The skin tag is soft and compressible so I do not think it is thrombosed, I do not think it is the cause of his discomfort.  Patient would like to have it removed.  Will refer to GI to consider colonoscopy and treatment options.

## 2023-08-25 ENCOUNTER — Ambulatory Visit: Payer: Self-pay | Admitting: Student in an Organized Health Care Education/Training Program

## 2023-08-28 LAB — TSH: TSH: 3.91 u[IU]/mL (ref 0.35–5.50)

## 2023-09-04 NOTE — Progress Notes (Signed)
 HPI: Mark Boone is a 33 y.o. male who presents to the [facility] pharmacy clinic for HIV PrEP follow-up.  Insured   [x]    Uninsured  []    Patient Active Problem List   Diagnosis Date Noted   Palpitations 08/22/2023   Anal fissure 08/22/2023   Hemorrhoidal skin tags 08/22/2023   Dyspnea 08/22/2023   High risk sexual behavior 09/25/2020   On pre-exposure prophylaxis for HIV 09/25/2020    Patient's Medications  New Prescriptions   No medications on file  Previous Medications   DOXYCYCLINE  (VIBRA -TABS) 100 MG TABLET    Take 2 tablets (200 mg) by mouth 24-72 hours after a sexual encounter   EMTRICITABINE -TENOFOVIR  AF (DESCOVY ) 200-25 MG TABLET    Take 1 tablet by mouth daily.   NITROGLYCERIN  0.4 % OINT    Apply a small amount to the rectum twice daily for up to 8 weeks   POLYETHYLENE GLYCOL (MIRALAX ) 17 G PACKET    Take 17 g by mouth daily.  Modified Medications   No medications on file  Discontinued Medications   No medications on file       08/28/2021   11:51 AM  [facility] HIV PREP FLOWSHEET RESULTS  Insurance Status Uninsured  Gender at birth Male  Gender identity cis-Male  Risk for HIV Hx of nPEP use  Sex Partners Men only  # sex partners past 3-6 mos 1  Sex activity preferences Insertive and receptive;Oral  Condom use Yes  % condom use 100  Treated for STI? No  HIV symptoms? None    Labs:  SCr: Lab Results  Component Value Date   CREATININE 0.76 05/28/2022   CREATININE 0.95 02/28/2022   CREATININE 0.91 01/15/2021   CREATININE 0.91 09/25/2020   CREATININE 1.11 05/17/2020   HIV Lab Results  Component Value Date   HIV NON-REACTIVE 06/13/2023   HIV NON-REACTIVE 03/20/2023   HIV NON-REACTIVE 12/11/2022   HIV NON-REACTIVE 09/04/2022   HIV NON-REACTIVE 05/22/2022   Hepatitis B Lab Results  Component Value Date   HEPBSAB REACTIVE (A) 09/25/2020   HEPBSAG NON-REACTIVE 09/25/2020   Hepatitis C No results found for: HEPCAB, HCVRNAPCRQN Hepatitis A No  results found for: HAV RPR and STI Lab Results  Component Value Date   LABRPR NON-REACTIVE 06/13/2023   LABRPR NON-REACTIVE 03/20/2023   LABRPR NON-REACTIVE 09/04/2022   LABRPR NON-REACTIVE 05/22/2022   LABRPR NON-REACTIVE 02/28/2022    STI Results GC CT  12/11/2022 11:23 AM Negative    Negative    Negative  Negative    Negative    Negative   09/04/2022 11:29 AM Positive    Negative    Negative  Negative    Negative    Negative   05/22/2022 10:45 AM Negative    Negative    Negative  Negative    Negative    Negative   02/28/2022  3:29 PM Negative    Negative    Negative  Negative    Negative    Negative   11/14/2021  2:13 PM Negative    Negative    Negative  Negative    Negative    Negative   08/28/2021 11:53 AM Negative    Negative    Negative  Negative    Negative    Negative   09/25/2020  2:44 PM Negative    Negative    Negative  Negative    Negative    Negative   07/05/2020  9:04 PM Negative  Negative   12/23/2019  2:07 PM Negative  Negative   05/25/2019 12:00 AM Negative  Negative   05/30/2017 12:00 AM Negative  Negative   03/22/2015 12:00 AM Negative  Negative     Assessment: Mark Boone is a 33 year-old male that presents to clinic today for oral PrEP follow up (on Descovy ). At last office visit on 03/28, he denied acute HIV symptoms including: fatigue, muscle aches, rash, sore throat, lymphadenopathy, headache, night sweats, N/V/D, and fever. He asked about Apretude for PrEP. He received counseling on the medication. He requested to review more information on the medication at home. HIV antibodies, RPR, and urine/rectal/oral cytologies were ordered, all of which returned negative.  Review of fill history reveals patient last filled Descovy  on 05/29.   Today he reported no complaints on Descovy . He stated he would not like to move forward with Apretude at this time. He agreed to STI screening.   Mark Boone is eligible for HPV and HAV vaccines. He chose to  defer vaccinations today.   Plan: -Collect HIV ab, oral/rectal/urine cytologies, and RPR -Will send prescription for Descovy  x 3 months if negative  -F/u on 09/26  Tolu Amere Bricco, PharmD [facility] PGY-1

## 2023-09-05 ENCOUNTER — Ambulatory Visit (INDEPENDENT_AMBULATORY_CARE_PROVIDER_SITE_OTHER): Admitting: Pharmacist

## 2023-09-05 ENCOUNTER — Other Ambulatory Visit: Payer: Self-pay

## 2023-09-05 DIAGNOSIS — Z113 Encounter for screening for infections with a predominantly sexual mode of transmission: Secondary | ICD-10-CM | POA: Diagnosis not present

## 2023-09-05 DIAGNOSIS — Z79899 Other long term (current) drug therapy: Secondary | ICD-10-CM | POA: Diagnosis not present

## 2023-09-06 LAB — CT/NG RNA, TMA RECTAL
Chlamydia Trachomatis RNA: NOT DETECTED
Neisseria Gonorrhoeae RNA: NOT DETECTED

## 2023-09-06 LAB — C. TRACHOMATIS/N. GONORRHOEAE RNA
C. trachomatis RNA, TMA: NOT DETECTED
N. gonorrhoeae RNA, TMA: NOT DETECTED

## 2023-09-06 LAB — GC/CHLAMYDIA PROBE, AMP (THROAT)
Chlamydia trachomatis RNA: NOT DETECTED
Neisseria gonorrhoeae RNA: NOT DETECTED

## 2023-09-06 LAB — HIV ANTIBODY (ROUTINE TESTING W REFLEX): HIV 1&2 Ab, 4th Generation: NONREACTIVE

## 2023-09-06 LAB — RPR: RPR Ser Ql: NONREACTIVE

## 2023-09-08 ENCOUNTER — Other Ambulatory Visit: Payer: Self-pay | Admitting: Pharmacist

## 2023-09-08 ENCOUNTER — Other Ambulatory Visit: Payer: Self-pay

## 2023-09-08 DIAGNOSIS — Z79899 Other long term (current) drug therapy: Secondary | ICD-10-CM

## 2023-09-08 MED ORDER — DESCOVY 200-25 MG PO TABS
1.0000 | ORAL_TABLET | Freq: Every day | ORAL | 2 refills | Status: DC
Start: 2023-09-08 — End: 2023-10-18
  Filled 2023-09-08 – 2023-09-12 (×2): qty 30, 30d supply, fill #0
  Filled 2023-10-06 – 2023-10-13 (×2): qty 30, 30d supply, fill #1

## 2023-09-09 ENCOUNTER — Other Ambulatory Visit: Payer: Self-pay

## 2023-09-12 ENCOUNTER — Other Ambulatory Visit: Payer: Self-pay

## 2023-09-15 ENCOUNTER — Other Ambulatory Visit: Payer: Self-pay

## 2023-09-15 NOTE — Progress Notes (Signed)
 Specialty Pharmacy Refill Coordination Note  Mark Boone is a 33 y.o. male contacted today regarding refills of specialty medication(s) Emtricitabine -Tenofovir  AF (Descovy )   Patient requested Delivery   Delivery date: 09/17/23   Verified address: [address] [place] [address]   Medication will be filled on 09/16/23.

## 2023-09-22 ENCOUNTER — Ambulatory Visit: Admitting: Student in an Organized Health Care Education/Training Program

## 2023-09-22 ENCOUNTER — Encounter: Payer: Self-pay | Admitting: Student in an Organized Health Care Education/Training Program

## 2023-09-22 VITALS — BP 132/87 | HR 61 | Wt 236.0 lb

## 2023-09-22 DIAGNOSIS — K602 Anal fissure, unspecified: Secondary | ICD-10-CM | POA: Diagnosis not present

## 2023-09-22 DIAGNOSIS — K644 Residual hemorrhoidal skin tags: Secondary | ICD-10-CM

## 2023-09-22 NOTE — Assessment & Plan Note (Signed)
 Discomfort from hemorrhoid and skin tag affecting sexual activity. Hemorrhoidal pain suspected. GI consultation needed for evaluation and treatment options. - Facilitate referral to GI specialist for evaluation and potential removal of skin tag and hemorrhoid. - Advise to answer calls from referral service to schedule appointment.

## 2023-09-22 NOTE — Progress Notes (Signed)
   Established Patient Office Visit  Subjective   Patient ID: Mark Boone, male    DOB: [birth year]  Age: 33 y.o. MRN: [id number]  Chief Complaint  Patient presents with   Medical Management of Chronic Issues    4 week follow up     HPI  Discussed the use of AI scribe software for clinical note transcription with the patient, who gave verbal consent to proceed.  History of Present Illness Mark Boone is a 33 year old male who presents with persistent anal pain associated with an anal fissure.  Anal pain and fissure symptoms - Persistent anal pain associated with an anal fissure, occurring every other day and lasting 1-2 hours per episode - Pain is quick in onset and has not improved over the past month - Pain typically begins about 20 minutes after wiping following a bowel movement - A palpable bump in the area is worsening - Warm sitz baths provide some pain relief - Anal pain has impacted ability to engage in sexual activity - No impact on work or other activities  Bowel habits - Loose bowel movements once daily - No constipation - No use of Miralax  due to absence of bowel movement issues  Topical nitroglycerin  ointment use and side effects - Nitroglycerin  ointment applied once or twice daily for the past month - No headaches or lightheadedness from ointment - Ointment causes leakage, resulting in frequent accidents  Skin tag management - Awaiting contact from GI specialist regarding removal of previously authorized skin tag      Objective:     BP (!) 146/89   Pulse 66   Wt 236 lb (107 kg)   BMI 32.32 kg/m    Physical Exam  Physical Exam Gen: Well-appearing Neck: Normal thyroid Heart: Regular, no murmur RECTAL: Anal fissure healing, it is very small on the posterior wall.  Hemorrhoidal skin tag presents on the anterior wall, less than 1 cm large, nontender to palpation, nonthrombosed      Assessment & Plan:   Assessment and  Plan  Problem List Items Addressed This Visit       Unprioritized   Anal fissure   Pain persists despite nitroglycerin  ointment, fissure healing but ointment may not be beneficial. Concern for worsening with further rectal medications. - Discontinue nitroglycerin  ointment. - Encourage warm sitz baths for pain relief. - Maintain soft bowel movements, use Miralax  if needed.      Hemorrhoidal skin tags - Primary   Discomfort from hemorrhoid and skin tag affecting sexual activity. Hemorrhoidal pain suspected. GI consultation needed for evaluation and treatment options. - Facilitate referral to GI specialist for evaluation and potential removal of skin tag and hemorrhoid. - Advise to answer calls from referral service to schedule appointment.       Return in about 3 months (around 12/23/2023).    Cleatus Debby Specking, MD

## 2023-09-22 NOTE — Patient Instructions (Signed)
 VISIT SUMMARY:  Today, you were seen for persistent anal pain associated with an anal fissure. We discussed your symptoms, including the pain's onset, duration, and impact on your daily life. We also reviewed your current treatment with nitroglycerin  ointment and its side effects, as well as your bowel habits and the management of your skin tag.  YOUR PLAN:  -ANAL FISSURE: An anal fissure is a small tear in the lining of the anus that can cause pain and discomfort. Since the nitroglycerin  ointment has not been effective and may worsen the condition, you should stop using it. Continue with warm sitz baths to help relieve pain and maintain soft bowel movements. Use Miralax  if needed to keep your bowel movements soft.  -HEMORRHOIDS WITH SKIN TAG: Hemorrhoids are swollen veins in the lower rectum or anus that can cause discomfort, and a skin tag is a small, benign growth of skin. The discomfort from your hemorrhoid and skin tag is affecting your sexual activity. We will refer you to a GI specialist for evaluation and potential removal of the skin tag and hemorrhoid. Please make sure to answer calls from the referral service to schedule your appointment.  INSTRUCTIONS:  Please discontinue the use of nitroglycerin  ointment and continue with warm sitz baths for pain relief. Maintain soft bowel movements and use Miralax  if needed. Answer calls from the referral service to schedule your appointment with the GI specialist for evaluation and potential removal of the skin tag and hemorrhoid.

## 2023-09-22 NOTE — Assessment & Plan Note (Signed)
 Pain persists despite nitroglycerin  ointment, fissure healing but ointment may not be beneficial. Concern for worsening with further rectal medications. - Discontinue nitroglycerin  ointment. - Encourage warm sitz baths for pain relief. - Maintain soft bowel movements, use Miralax  if needed.

## 2023-10-06 ENCOUNTER — Other Ambulatory Visit: Payer: Self-pay

## 2023-10-08 ENCOUNTER — Other Ambulatory Visit (HOSPITAL_COMMUNITY): Payer: Self-pay

## 2023-10-13 ENCOUNTER — Other Ambulatory Visit: Payer: Self-pay

## 2023-10-13 NOTE — Progress Notes (Signed)
 Specialty Pharmacy Refill Coordination Note  Mark Boone is a 33 y.o. male contacted today regarding refills of specialty medication(s) Emtricitabine -Tenofovir  AF (Descovy )   Patient requested Delivery   Delivery date: 10/15/23   Verified address: [address] [place] [address]   Medication will be filled on 10/14/23.

## 2023-10-18 ENCOUNTER — Other Ambulatory Visit (HOSPITAL_COMMUNITY): Payer: Self-pay

## 2023-10-18 ENCOUNTER — Encounter (HOSPITAL_BASED_OUTPATIENT_CLINIC_OR_DEPARTMENT_OTHER): Payer: Self-pay | Admitting: *Deleted

## 2023-10-18 ENCOUNTER — Emergency Department (HOSPITAL_BASED_OUTPATIENT_CLINIC_OR_DEPARTMENT_OTHER)
Admission: EM | Admit: 2023-10-18 | Discharge: 2023-10-18 | Disposition: A | Discharge status: Home / Self Care | Attending: Emergency Medicine | Admitting: Emergency Medicine

## 2023-10-18 ENCOUNTER — Other Ambulatory Visit: Payer: Self-pay

## 2023-10-18 DIAGNOSIS — Z760 Encounter for issue of repeat prescription: Secondary | ICD-10-CM | POA: Insufficient documentation

## 2023-10-18 DIAGNOSIS — Z79899 Other long term (current) drug therapy: Secondary | ICD-10-CM

## 2023-10-18 MED ORDER — DESCOVY 200-25 MG PO TABS: 1.0000 | ORAL_TABLET | Freq: Every day | ORAL | 0 refills | Status: DC

## 2023-10-18 MED ORDER — BICTEGRAVIR-EMTRICITAB-TENOFOV 50-200-25 MG PO PREPACK
ORAL_TABLET | ORAL | Status: AC
  Filled 2023-10-18: qty 1
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
## 2023-10-18 NOTE — Discharge Instructions (Signed)
 You were evaluated in the emergency room for medication refill.  I have sent a prescription of disco V into your pharmacy.  Please follow-up with your primary care doctor for further management.

## 2023-10-18 NOTE — ED Provider Notes (Signed)
 [place] EMERGENCY DEPARTMENT AT [facility] Provider Note   CSN: [id number] Arrival date & time: 10/18/23  8094     Patient presents with: Medication Refill   Mark Boone is a 33 y.o. male.    Medication Refill  Past Medical History:  Diagnosis Date   Hemorrhoids    History reviewed. No pertinent surgical history.     Prior to Admission medications   Medication Sig Start Date End Date Taking? Authorizing Provider  doxycycline  (VIBRA -TABS) 100 MG tablet Take 2 tablets (200 mg) by mouth 24-72 hours after a sexual encounter Patient not taking: Reported on 08/22/2023 09/05/22   Kuppelweiser, Cassie L, RPH-CPP  emtricitabine -tenofovir  AF (DESCOVY ) 200-25 MG tablet Take 1 tablet by mouth daily. 10/18/23   Donnajean Lynwood DEL, PA-C    Allergies: Patient has no known allergies.    Review of Systems  All other systems reviewed and are negative.   Updated Vital Signs BP 132/86 (BP Location: Left Arm)   Pulse 67   Temp 97.8 F (36.6 C) (Oral)   Resp 14   SpO2 100%   Physical Exam Vitals and nursing note reviewed.  Constitutional:      General: He is not in acute distress.    Appearance: He is well-developed.  HENT:     Head: Normocephalic and atraumatic.  Eyes:     Conjunctiva/sclera: Conjunctivae normal.  Cardiovascular:     Rate and Rhythm: Normal rate.     Heart sounds: No murmur heard. Pulmonary:     Effort: Pulmonary effort is normal. No respiratory distress.     Breath sounds: Normal breath sounds.  Abdominal:     Palpations: Abdomen is soft.     Tenderness: There is no abdominal tenderness.  Musculoskeletal:        General: No swelling.     Cervical back: Neck supple.  Skin:    General: Skin is warm and dry.     Capillary Refill: Capillary refill takes less than 2 seconds.  Neurological:     Mental Status: He is alert.  Psychiatric:        Mood and Affect: Mood normal.     (all labs ordered are listed, but only abnormal results are  displayed) Labs Reviewed - No data to display  EKG: None  Radiology: No results found.   Procedures   Medications Ordered in the ED  bictegravir-emtricitabine -tenofovir  AF (BIKTARVY) 50-200-25 MG Prepack (has no administration in time range)                                    Medical Decision Making  This patient presents to the ED with chief complaint(s) of med refill .  The complaint involves an extensive differential diagnosis and also carries with it a high risk of complications and morbidity.   Pertinent past medical history as listed in HPI   Additional history obtained: Records reviewed Care Everywhere/External Records  Assessment and management:   Patient presents for medication refill.  States he ran out of his Descovy .  He is is without any complaints.  Will send in prescription.  He is hemodynamically stable and safe for discharge.  Independent ECG interpretation:  none  Independent labs interpretation:  The following labs were independently interpreted:  none  Independent visualization and interpretation of imaging: I independently visualized the following imaging with scope of interpretation limited to determining acute life threatening conditions related to emergency care:  none    Consultations obtained:   none  Disposition:   Patient will be discharged home. The patient has been appropriately medically screened and/or stabilized in the ED. I have low suspicion for any other emergent medical condition which would require further screening, evaluation or treatment in the ED or require inpatient management. At time of discharge the patient is hemodynamically stable and in no acute distress. I have discussed work-up results and diagnosis with patient and answered all questions. Patient is agreeable with discharge plan. We discussed strict return precautions for returning to the emergency department and they verbalized understanding.     Social Determinants  of Health:   none  This note was dictated with voice recognition software.  Despite best efforts at proofreading, errors may have occurred which can change the documentation meaning.       Final diagnoses:  Medication refill    ED Discharge Orders          Ordered    emtricitabine -tenofovir  AF (DESCOVY ) 200-25 MG tablet  Daily        10/18/23 2048               Donnajean Lynwood VEAR DEVONNA 10/18/23 2049    Armenta Canning, MD 10/19/23 [phone]

## 2023-11-03 ENCOUNTER — Other Ambulatory Visit: Payer: Self-pay

## 2023-11-04 ENCOUNTER — Other Ambulatory Visit: Payer: Self-pay

## 2023-11-04 ENCOUNTER — Other Ambulatory Visit: Payer: Self-pay | Admitting: Pharmacy Technician

## 2023-11-04 NOTE — Progress Notes (Signed)
 Disenrolled; Rx was sent to [facility]; [facility] aware.

## 2023-11-11 ENCOUNTER — Other Ambulatory Visit: Payer: Self-pay

## 2023-11-11 ENCOUNTER — Other Ambulatory Visit: Payer: Self-pay | Admitting: Pharmacist

## 2023-11-11 ENCOUNTER — Other Ambulatory Visit (HOSPITAL_COMMUNITY): Payer: Self-pay

## 2023-11-11 DIAGNOSIS — Z79899 Other long term (current) drug therapy: Secondary | ICD-10-CM

## 2023-11-11 MED ORDER — DESCOVY 200-25 MG PO TABS
1.0000 | ORAL_TABLET | Freq: Every day | ORAL | 0 refills | Status: DC
  Filled 2023-11-11: qty 30, 30d supply, fill #0

## 2023-11-11 NOTE — Progress Notes (Signed)
 Specialty Pharmacy Initial Fill Coordination Note  Mark Boone is a 33 y.o. male contacted today regarding initial fill of specialty medication(s) Emtricitabine -Tenofovir  AF (Descovy )   Patient requested Pickup at [facility] at [place] date: 11/13/23   Medication will be filled on 11/12/23.   Patient is aware of $0 copayment.

## 2023-11-11 NOTE — Progress Notes (Signed)
 Specialty Pharmacy Initiation Note   Mark Boone is a 33 y.o. male who will be followed by the specialty pharmacy service for RxSp HIV PrEP    Review of administration, indication, effectiveness, safety, potential side effects, storage/disposable, and missed dose instructions occurred today for patient's specialty medication(s) Emtricitabine -Tenofovir  AF (Descovy )     Patient/Caregiver did not have any additional questions or concerns.   Patient's therapy is appropriate to: Continue    Goals Addressed             This Visit's Progress    Maintain optimal adherence to therapy   On track    Patient is not on track and improving. Patient will work on increased adherence      Practice safe sex   On track    Patient is on track. Patient will be evaluated at upcoming provider appointment to assess progress         Alan JINNY Geralds Specialty Pharmacist

## 2023-11-11 NOTE — Progress Notes (Signed)
 Patient lost one month supply of Descovy  and needs additional month before following up in clinic next month.  Alan Geralds, PharmD, CPP, BCIDP, AAHIVP Clinical Pharmacist Practitioner Infectious Diseases Clinical Pharmacist [facility]

## 2023-11-12 ENCOUNTER — Encounter: Payer: Self-pay | Admitting: Gastroenterology

## 2023-11-12 ENCOUNTER — Ambulatory Visit: Admitting: Gastroenterology

## 2023-11-12 ENCOUNTER — Other Ambulatory Visit

## 2023-11-12 ENCOUNTER — Other Ambulatory Visit: Payer: Self-pay

## 2023-11-12 VITALS — BP 120/80 | HR 65 | Ht 71.5 in | Wt 227.5 lb

## 2023-11-12 DIAGNOSIS — K625 Hemorrhage of anus and rectum: Secondary | ICD-10-CM | POA: Diagnosis not present

## 2023-11-12 DIAGNOSIS — K649 Unspecified hemorrhoids: Secondary | ICD-10-CM

## 2023-11-12 DIAGNOSIS — K6289 Other specified diseases of anus and rectum: Secondary | ICD-10-CM | POA: Diagnosis not present

## 2023-11-12 DIAGNOSIS — K602 Anal fissure, unspecified: Secondary | ICD-10-CM

## 2023-11-12 LAB — CBC WITH DIFFERENTIAL/PLATELET
Basophils Absolute: 0 K/uL (ref 0.0–0.1)
Basophils Relative: 0.7 % (ref 0.0–3.0)
Eosinophils Absolute: 0.1 K/uL (ref 0.0–0.7)
Eosinophils Relative: 3.1 % (ref 0.0–5.0)
HCT: 39.7 % (ref 39.0–52.0)
Hemoglobin: 13.5 g/dL (ref 13.0–17.0)
Lymphocytes Relative: 53.7 % — ABNORMAL HIGH (ref 12.0–46.0)
Lymphs Abs: 1.9 K/uL (ref 0.7–4.0)
MCHC: 33.9 g/dL (ref 30.0–36.0)
MCV: 94.8 fl (ref 78.0–100.0)
Monocytes Absolute: 0.3 K/uL (ref 0.1–1.0)
Monocytes Relative: 9.5 % (ref 3.0–12.0)
Neutro Abs: 1.2 K/uL — ABNORMAL LOW (ref 1.4–7.7)
Neutrophils Relative %: 33 % — ABNORMAL LOW (ref 43.0–77.0)
Platelets: 180 K/uL (ref 150.0–400.0)
RBC: 4.19 Mil/uL — ABNORMAL LOW (ref 4.22–5.81)
RDW: 12.6 % (ref 11.5–15.5)
WBC: 3.6 K/uL — ABNORMAL LOW (ref 4.0–10.5)

## 2023-11-12 LAB — COMPREHENSIVE METABOLIC PANEL WITH GFR
ALT: 29 U/L (ref 0–53)
AST: 36 U/L (ref 0–37)
Albumin: 4.2 g/dL (ref 3.5–5.2)
Alkaline Phosphatase: 48 U/L (ref 39–117)
BUN: 13 mg/dL (ref 6–23)
CO2: 25 meq/L (ref 19–32)
Calcium: 9 mg/dL (ref 8.4–10.5)
Chloride: 106 meq/L (ref 96–112)
Creatinine, Ser: 0.79 mg/dL (ref 0.40–1.50)
GFR: 116.37 mL/min (ref >60.00)
Glucose, Bld: 81 mg/dL (ref 70–99)
Potassium: 3.8 meq/L (ref 3.5–5.1)
Sodium: 141 meq/L (ref 135–145)
Total Bilirubin: 1.1 mg/dL (ref 0.2–1.2)
Total Protein: 7.6 g/dL (ref 6.0–8.3)

## 2023-11-12 MED ORDER — NA SULFATE-K SULFATE-MG SULF 17.5-3.13-1.6 GM/177ML PO SOLN: 1.0000 | Freq: Once | ORAL | 0 refills | Status: AC

## 2023-11-12 MED ORDER — AMBULATORY NON FORMULARY MEDICATION: 1 refills | Status: DC

## 2023-11-12 NOTE — Progress Notes (Signed)
 Mark Boone [id number] 12/28/1990   Chief Complaint: Hemorrhoids, rectal pain, rectal bleeding  Referring Provider: Jerrell Cleatus [name] Primary GI MD: Sampson  HPI: Mark Boone is a 33 y.o. male who presents today for a complaint of hemorrhoids, rectal pain, and rectal bleeding.    Patient seen by PCP 09/22/2023 for persistent pain associated with anal fissure despite use of nitroglycerin  ointment.  Noted to have a healing fissure but advised to discontinue nitroglycerin  ointment.  Also noted discomfort from hemorrhoid and skin tag affecting sexual activity.  Referred to GI for further evaluation.  Recent negative STI testing, normal TSH.   Patient states he has been having rectal pain and bleeding for about 8 months.  Was using topical nitroglycerin  for anal fissure but states that this seemed to cause a problem with rectal discharge/seepage so he stopped using.  Continues to have pain with bowel movements and pain if he touches anal area, pain with wiping after bowel movements.  States he has also tried hydrocortisone  cream without improvement.  Has a bowel movement once a day and denies any hard stools or constipation.  Denies diarrhea.  Sees bright red blood every time or every other time he has a bowel movement.  Sees blood on the toilet paper which is pink/red.  States it is more than just a drop, can have a significant amount of bleeding.  Denies any blood in his stool.  Denies abdominal pain, chest pain, shortness of breath.  Denies heart or lung problems.  Denies any upper GI symptoms including acid reflux, heartburn, nausea, vomiting.  Patient does engage in anal intercourse with men.  States that current symptoms are affecting his sexual activity.  He is interested in possible hemorrhoidectomy/removal of skin tag.  Previous GI Procedures/Imaging      Past Medical History:  Diagnosis Date   Hemorrhoids     History reviewed. No pertinent surgical  history.  Current Outpatient Medications  Medication Sig Dispense Refill   doxycycline  (VIBRA -TABS) 100 MG tablet Take 2 tablets (200 mg) by mouth 24-72 hours after a sexual encounter (Patient not taking: Reported on 08/22/2023) 30 tablet 0   emtricitabine -tenofovir  AF (DESCOVY ) 200-25 MG tablet Take 1 tablet by mouth daily. 30 tablet 0   No current facility-administered medications for this visit.    Allergies as of 11/12/2023   (No Known Allergies)    Family History  Problem Relation Age of Onset   Diabetes type II Mother    Hypertension Mother    Sarcoidosis Father    Sarcoidosis Brother     Social History   Tobacco Use   Smoking status: Former    Types: Cigarettes   Smokeless tobacco: Never   Tobacco comments:    3 cigarettes/day  Vaping Use   Vaping status: Some Days  Substance Use Topics   Alcohol use: Yes    Comment: Socially    Drug use: No     Review of Systems:    Constitutional: No unexplained weight loss, fever, chills Cardiovascular: No chest pain Respiratory: No SOB Gastrointestinal: See HPI and otherwise negative Hematologic: BRBPR    Physical Exam:  Vital signs: BP 120/80 (BP Location: Left Arm, Patient Position: Sitting, Cuff Size: Normal)   Pulse 65   Ht 5' 11.5 (1.816 m)   Wt 227 lb 8 oz (103.2 kg)   SpO2 97%   BMI 31.29 kg/m   Constitutional: Pleasant, well-appearing male in NAD, alert and cooperative Head:  Normocephalic and atraumatic.  Eyes: No scleral  icterus. Respiratory: Respirations even and unlabored. Lungs clear to auscultation bilaterally.  No wheezes, crackles, or rhonchi.  Cardiovascular:  Regular rate and rhythm. No murmurs. No peripheral edema. Gastrointestinal:  Soft, nondistended, nontender. No rebound or guarding. Normal bowel sounds. No appreciable masses or hepatomegaly. Rectal: Small nonbleeding, nonthrombosed external hemorrhoid and perianal skin tag.  Posterior midline anal fissure which does appear to be healing  and is not currently bleeding.  Painful on palpation.  Small amount of brown stool in rectum.  Chaperone present for exam. Neurologic:  Alert and oriented x4;  grossly normal neurologically.  Skin:   Dry and intact without significant lesions or rashes. Psychiatric: Oriented to person, place and time. Demonstrates good judgement and reason without abnormal affect or behaviors.   RELEVANT LABS AND IMAGING: CBC    Component Value Date/Time   WBC 4.3 05/28/2022 0217   RBC 4.34 05/28/2022 0217   HGB 14.1 05/28/2022 0217   HCT 43.0 05/28/2022 0217   PLT 160 05/28/2022 0217   MCV 99.1 05/28/2022 0217   MCH 32.5 05/28/2022 0217   MCHC 32.8 05/28/2022 0217   RDW 12.0 05/28/2022 0217   LYMPHSABS 1.6 05/28/2022 0217   MONOABS 0.4 05/28/2022 0217   EOSABS 0.1 05/28/2022 0217   BASOSABS 0.0 05/28/2022 0217    CMP     Component Value Date/Time   NA 137 05/28/2022 0217   K 3.8 05/28/2022 0217   CL 109 05/28/2022 0217   CO2 25 05/28/2022 0217   GLUCOSE 112 (H) 05/28/2022 0217   BUN 12 05/28/2022 0217   CREATININE 0.76 05/28/2022 0217   CREATININE 0.95 02/28/2022 1540   CALCIUM 8.6 (L) 05/28/2022 0217   PROT 7.4 01/15/2021 1452   ALBUMIN 3.9 01/15/2021 1452   AST 16 01/15/2021 1452   ALT 10 01/15/2021 1452   ALKPHOS 57 01/15/2021 1452   BILITOT 0.6 01/15/2021 1452   GFRNONAA >60 05/28/2022 0217   GFRAA >60 08/09/2017 1657     Assessment/Plan:   Rectal pain Rectal bleeding Anal fissure Hemorrhoids Patient seen today for evaluation of rectal pain and bleeding. Reports he has been having daily rectal pain and bleeding for about 8 months.  Notices bleeding with every bowel movement or every other bowel movement.  Sees blood on the toilet paper, states it can be a significant amount.  Denies blood in the stool itself.  Has tried topical hydrocortisone  cream for external hemorrhoid but continues to have rectal pain.  Has also been prescribed nitroglycerin  topical treatment for anal  fissure, but was unable to tolerate this.  States that it caused rectal leakage.   On exam does have a small nonthrombosed, nonbleeding external hemorrhoid and perianal skin tag, as well as posterior midline anal fissure which does appear to be in the healing process.  He has tenderness on exam. Patient engages in anal intercourse with men and symptoms have affected sexual activity.  Recently had negative STI testing. Due to persistent pain and bleeding which has not responded to treatment, recommend colonoscopy for further evaluation and to rule out any underlying malignancy or inflammatory process. He is interested in hemorrhoidectomy/skin tag removal.  Following colonoscopy discussed we can consider referral to surgery/dermatology for this.  - Schedule colonoscopy. I thoroughly discussed the procedure with the patient to include nature of the procedure, alternatives, benefits, and risks (including but not limited to bleeding, infection, perforation, anesthesia/cardiac/pulmonary complications). Patient verbalized understanding and gave verbal consent to proceed with procedure.  - Labs today: CBC, CMP - Send  diltiazem 2%/lidocaine  5% cream for treatment of anal fissure.  Apply 3 times daily for 8 weeks. - Recommend increased dietary fiber and water  intake - After colonoscopy consider referral to surgery to discuss hemorrhoidectomy/skin tag removal   Camie Furbish, PA-C [facility] 11/12/2023, 10:07 AM  Patient Care Team: Jerrell Cleatus Ned, MD as PCP - General (Internal Medicine)

## 2023-11-12 NOTE — Patient Instructions (Addendum)
 You have been scheduled for a colonoscopy with Dr. Nandigam . Please follow written instructions given to you at your visit today.   If you use inhalers (even only as needed), please bring them with you on the day of your procedure.  DO NOT TAKE 7 DAYS PRIOR TO TEST- Trulicity (dulaglutide) Ozempic, Wegovy (semaglutide) Mounjaro (tirzepatide) Bydureon Bcise (exanatide extended release)  DO NOT TAKE 1 DAY PRIOR TO YOUR TEST Rybelsus (semaglutide) Adlyxin (lixisenatide) Victoza (liraglutide) Byetta (exanatide) ___________________________________________________________________________  We have sent a prescription for Diltiazem 2% gel with 5% Lidocaine  to [facility]. You should apply a pea size amount to your rectum three times daily for 8 weeks.  [facility]'s information is below: Address: [address] [place], [place] [address]  [phone]  *Please DO NOT go directly from our office to pick up this medication! Give the pharmacy 1 day to process the prescription as this is compounded and takes time to make.  ___________________________________________________________________________  Please go to the lab in the basement of our building to have lab work done as you leave today. Hit B for basement when you get on the elevator.  When the doors open the lab is on your left.  We will call you with the results. Thank you.  Increase your dietary fiber  Please purchase the following medications over the counter and take as directed: Fiber supplement like Benefiber  Increase your water  intake  Thank you for entrusting me with your care and for choosing [facility], Mark Furbish, PA    _______________________________________________________  If your blood pressure at your visit was 140/90 or greater, please contact your primary care physician to follow up on this.  _______________________________________________________  If you are age 44 or  older, your body mass index should be between 23-30. Your Body mass index is 31.29 kg/m. If this is out of the aforementioned range listed, please consider follow up with your Primary Care Provider.  If you are age 34 or younger, your body mass index should be between 19-25. Your Body mass index is 31.29 kg/m. If this is out of the aformentioned range listed, please consider follow up with your Primary Care Provider.   ________________________________________________________  The [facility] providers would like to encourage you to use MYCHART to communicate with providers for non-urgent requests or questions.  Due to long hold times on the telephone, sending your provider a message by [facility] may be a faster and more efficient way to get a response.  Please allow 48 business hours for a response.  Please remember that this is for non-urgent requests.  _______________________________________________________  [facility] is using a team-based approach to care.  Your team is made up of your doctor and two to three APPS. Our APPS (Nurse Practitioners and Physician Assistants) work with your physician to ensure care continuity for you. They are fully qualified to address your health concerns and develop a treatment plan. They communicate directly with your gastroenterologist to care for you. Seeing the Advanced Practice Practitioners on your physician's team can help you by facilitating care more promptly, often allowing for earlier appointments, access to diagnostic testing, procedures, and other specialty referrals.

## 2023-11-13 ENCOUNTER — Ambulatory Visit: Payer: Self-pay | Admitting: Gastroenterology

## 2023-11-13 ENCOUNTER — Other Ambulatory Visit (HOSPITAL_COMMUNITY): Payer: Self-pay

## 2023-11-13 NOTE — Progress Notes (Signed)
 The patient has been notified of this information and all questions answered.

## 2023-11-14 ENCOUNTER — Ambulatory Visit: Admitting: Gastroenterology

## 2023-11-14 ENCOUNTER — Encounter: Payer: Self-pay | Admitting: Gastroenterology

## 2023-11-14 VITALS — BP 130/80 | HR 58 | Temp 97.9°F | Resp 17 | Ht 71.5 in | Wt 227.0 lb

## 2023-11-14 DIAGNOSIS — K648 Other hemorrhoids: Secondary | ICD-10-CM | POA: Diagnosis present

## 2023-11-14 DIAGNOSIS — K649 Unspecified hemorrhoids: Secondary | ICD-10-CM

## 2023-11-14 DIAGNOSIS — K529 Noninfective gastroenteritis and colitis, unspecified: Secondary | ICD-10-CM

## 2023-11-14 DIAGNOSIS — K644 Residual hemorrhoidal skin tags: Secondary | ICD-10-CM

## 2023-11-14 DIAGNOSIS — K625 Hemorrhage of anus and rectum: Secondary | ICD-10-CM

## 2023-11-14 DIAGNOSIS — K602 Anal fissure, unspecified: Secondary | ICD-10-CM

## 2023-11-14 DIAGNOSIS — Z206 Contact with and (suspected) exposure to human immunodeficiency virus [HIV]: Secondary | ICD-10-CM

## 2023-11-14 DIAGNOSIS — K6289 Other specified diseases of anus and rectum: Secondary | ICD-10-CM | POA: Diagnosis not present

## 2023-11-14 HISTORY — DX: Contact with and (suspected) exposure to human immunodeficiency virus (hiv): Z20.6

## 2023-11-14 MED ORDER — SODIUM CHLORIDE 0.9 % IV SOLN: 500.0000 mL | Freq: Once | INTRAVENOUS | Status: DC

## 2023-11-14 MED ORDER — HYDROCORTISONE ACE-PRAMOXINE 1-1 % EX CREA: 1.0000 | TOPICAL_CREAM | Freq: Two times a day (BID) | CUTANEOUS | 1 refills | Status: AC

## 2023-11-14 MED ORDER — HYDROCORTISONE ACE-PRAMOXINE 1-1 % EX CREA: 1.0000 | TOPICAL_CREAM | Freq: Two times a day (BID) | CUTANEOUS | 1 refills | Status: DC

## 2023-11-14 NOTE — Progress Notes (Signed)
 [facility] History and Physical   Primary Care Physician:  Jerrell Cleatus Ned, MD   Reason for Procedure:  Rectal bleeding, rectal pain  Plan:    Colonscopy with possible interventions as needed     HPI: Mark Boone is a very pleasant 33 y.o. male here for colonoscopy for rectal bleeding, intermittent diarrhea and rectal pain, please refer to office visit note by Camie Furbish for further details.   The risks and benefits as well as alternatives of endoscopic procedure(s) have been discussed and reviewed. All questions answered. The patient agrees to proceed.    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 No pertinent surgical history.  Prior to Admission medications   Medication Sig Start Date End Date Taking? Authorizing Provider  emtricitabine -tenofovir  AF (DESCOVY ) 200-25 MG tablet Take 1 tablet by mouth daily. 11/11/23  Yes Waddell Alan PARAS, RPH-CPP  AMBULATORY NON FORMULARY MEDICATION Diltiazem gel with 5% lidocaine   Apply a pea sized amount into your rectum three times daily for 8 weeks Dispense 30 GM Patient not taking: Reported on 11/14/2023 11/12/23   Furbish Camie BRAVO, PA-C  doxycycline  (VIBRA -TABS) 100 MG tablet Take 2 tablets (200 mg) by mouth 24-72 hours after a sexual encounter Patient not taking: Reported on 08/22/2023 09/05/22   Kuppelweiser, Charlott L, RPH-CPP    Current Outpatient Medications  Medication Sig Dispense Refill   emtricitabine -tenofovir  AF (DESCOVY ) 200-25 MG tablet Take 1 tablet by mouth daily. 30 tablet 0   AMBULATORY NON FORMULARY MEDICATION Diltiazem gel with 5% lidocaine   Apply a pea sized amount into your rectum three times daily for 8 weeks Dispense 30 GM (Patient not taking: Reported on 11/14/2023) 30 g 1   doxycycline  (VIBRA -TABS) 100 MG tablet Take 2 tablets (200 mg) by mouth 24-72 hours after a sexual encounter (Patient not taking: Reported on 08/22/2023) 30 tablet  0   Current Facility-Administered Medications  Medication Dose Route Frequency Provider Last Rate Last Admin   0.9 %  sodium chloride  infusion  500 mL Intravenous Once Alik Mawson V, MD        Allergies as of 11/14/2023   (No Known Allergies)    Family History  Problem Relation Age of Onset   Diabetes type II Mother    Hypertension Mother    Sarcoidosis Father    Sarcoidosis Brother    Colon cancer Neg Hx    Colon polyps Neg Hx    Esophageal cancer Neg Hx    Rectal cancer Neg Hx    Stomach cancer Neg Hx     Social History   Socioeconomic History   Marital status: Single    Spouse name: Not on file   Number of children: Not on file   Years of education: Not on file   Highest education level: Not on file  Occupational History   Not on file  Tobacco Use   Smoking status: Former    Types: Cigarettes   Smokeless tobacco: Never   Tobacco comments:    3 cigarettes/day        Last cigarette 9 am per pt 11/14/23  Vaping Use   Vaping status: Some Days  Substance and Sexual Activity   Alcohol use: Yes    Comment: Socially    Drug use: Not Currently   Sexual activity: Yes    Comment: 10/2023- exposure to HIV  Other Topics Concern   Not on file  Social History Narrative  Not on file   Social Drivers of Health   Financial Resource Strain: Not on file  Food Insecurity: Not on file  Transportation Needs: Not on file  Physical Activity: Not on file  Stress: Not on file  Social Connections: Unknown (07/31/2021)   Received from [facility]   Social Network    Social Network: Not on file  Intimate Partner Violence: Unknown (06/22/2021)   Received from [facility]   HITS    Physically Hurt: Not on file    Insult or Talk Down To: Not on file    Threaten Physical Harm: Not on file    Scream or Curse: Not on file    Review of Systems:  All other review of systems negative except as mentioned in the HPI.  Physical Exam: Vital signs in last 24 hours: BP  134/84   Pulse 72   Temp 97.9 F (36.6 C) (Temporal)   Ht 5' 11.5 (1.816 m)   Wt 227 lb (103 kg)   SpO2 99%   BMI 31.22 kg/m  General:   Alert, NAD Lungs:  Clear .   Heart:  Regular rate and rhythm Abdomen:  Soft, nontender and nondistended. Neuro/Psych:  Alert and cooperative. Normal mood and affect. A and O x 3  Reviewed labs, radiology imaging, old records and pertinent past GI work up  Patient is appropriate for planned procedure(s) and anesthesia in an ambulatory setting   K. Veena Maxmillian Carsey , MD [phone]

## 2023-11-14 NOTE — Op Note (Signed)
 [facility] Patient Name: Mark Boone Procedure Date: 11/14/2023 11:09 AM MRN: [id number] Endoscopist: Gustav ALONSO Mcgee , MD, [id number] Age: 33 Referring MD:  Date of Birth: [birth year] Gender: Male Account #: [id number] Procedure:                Colonoscopy Indications:              Evaluation of unexplained GI bleeding presenting                            with Hematochezia, Chronic diarrhea Medicines:                Monitored Anesthesia Care Procedure:                Pre-Anesthesia Assessment:                           - Prior to the procedure, a History and Physical                            was performed, and patient medications and                            allergies were reviewed. The patient's tolerance of                            previous anesthesia was also reviewed. The risks                            and benefits of the procedure and the sedation                            options and risks were discussed with the patient.                            All questions were answered, and informed consent                            was obtained. Prior Anticoagulants: The patient has                            taken no anticoagulant or antiplatelet agents. ASA                            Grade Assessment: I - A normal, healthy patient.                            After reviewing the risks and benefits, the patient                            was deemed in satisfactory condition to undergo the                            procedure.  After obtaining informed consent, the colonoscope                            was passed under direct vision. Throughout the                            procedure, the patient's blood pressure, pulse, and                            oxygen saturations were monitored continuously. The                            Olympus Scope SN: [id number] was introduced through                            the anus and advanced to  the the cecum, identified                            by appendiceal orifice and ileocecal valve. The                            colonoscopy was performed without difficulty. The                            patient tolerated the procedure well. The quality                            of the bowel preparation was good. The ileocecal                            valve, appendiceal orifice, and rectum were                            photographed. Scope In: 11:29:55 AM Scope Out: 11:40:13 AM Scope Withdrawal Time: 0 hours 6 minutes 37 seconds  Total Procedure Duration: 0 hours 10 minutes 18 seconds  Findings:                 The perianal and digital rectal examinations were                            normal.                           Non-bleeding external and internal hemorrhoids were                            found during retroflexion. The hemorrhoids were                            medium-sized.                           Normal mucosa was found in the entire colon.  Biopsies for histology were taken with a cold                            forceps from the entire colon for evaluation of                            microscopic colitis. Complications:            No immediate complications. Estimated Blood Loss:     Estimated blood loss was minimal. Impression:               - Non-bleeding external and internal hemorrhoids.                           - Normal mucosa in the entire examined colon.                            Biopsied. Recommendation:           - Resume previous diet.                           - Continue present medications.                           - Await pathology results.                           - Repeat colonoscopy at age 66 for screening                            purposes.                           - Anusol  (pramoxine) HC cream: Apply externally BID                            for 1 week. Tandy Grawe V. Aleen Marston, MD 11/14/2023 11:49:03 AM This report  has been signed electronically.

## 2023-11-14 NOTE — Progress Notes (Signed)
 Report given to PACU, vss

## 2023-11-14 NOTE — Progress Notes (Signed)
 Called to room to assist during endoscopic procedure.  Patient ID and intended procedure confirmed with present staff. Received instructions for my participation in the procedure from the performing physician.

## 2023-11-14 NOTE — Progress Notes (Signed)
 Pt's states no medical or surgical changes since previsit or office visit.

## 2023-11-14 NOTE — Patient Instructions (Addendum)
 Resume previous diet.                           - Continue present medications.                           - Await pathology results.                           - Repeat colonoscopy at age 33 for screening                            purposes.                           - Anusol  (pramoxine) HC cream: Apply externally BID                            for 1 week.     YOU HAD AN ENDOSCOPIC PROCEDURE TODAY AT THE [facility]:   Refer to the procedure report that was given to you for any specific questions about what was found during the examination.  If the procedure report does not answer your questions, please call your gastroenterologist to clarify.  If you requested that your care partner not be given the details of your procedure findings, then the procedure report has been included in a sealed envelope for you to review at your convenience later.  YOU SHOULD EXPECT: Some feelings of bloating in the abdomen. Passage of more gas than usual.  Walking can help get rid of the air that was put into your GI tract during the procedure and reduce the bloating. If you had a lower endoscopy (such as a colonoscopy or flexible sigmoidoscopy) you may notice spotting of blood in your stool or on the toilet paper. If you underwent a bowel prep for your procedure, you may not have a normal bowel movement for a few days.  Please Note:  You might notice some irritation and congestion in your nose or some drainage.  This is from the oxygen used during your procedure.  There is no need for concern and it should clear up in a day or so.  SYMPTOMS TO REPORT IMMEDIATELY:  Following lower endoscopy (colonoscopy or flexible sigmoidoscopy):  Excessive amounts of blood in the stool  Significant tenderness or worsening of abdominal pains  Swelling of the abdomen that is new, acute  Fever of 100F or higher   For urgent or emergent issues, a gastroenterologist can be reached at any hour by calling [phone]  [phone]. Do not use MyChart messaging for urgent concerns.    DIET:  We do recommend a small meal at first, but then you may proceed to your regular diet.  Drink plenty of fluids but you should avoid alcoholic beverages for 24 hours.  ACTIVITY:  You should plan to take it easy for the rest of today and you should NOT DRIVE or use heavy machinery until tomorrow (because of the sedation medicines used during the test).    FOLLOW UP: Our staff will call the number listed on your records the next business day following your procedure.  We will call around 7:15- 8:00 am to check on you and address any questions or concerns that you may have regarding the  information given to you following your procedure. If we do not reach you, we will leave a message.     If any biopsies were taken you will be contacted by phone or by letter within the next 1-3 weeks.  Please call us  at [phone] if you have not heard about the biopsies in 3 weeks.    SIGNATURES/CONFIDENTIALITY: You and/or your care partner have signed paperwork which will be entered into your electronic medical record.  These signatures attest to the fact that that the information above on your After Visit Summary has been reviewed and is understood.  Full responsibility of the confidentiality of this discharge information lies with you and/or your care-partner.

## 2023-11-18 ENCOUNTER — Telehealth: Payer: Self-pay | Admitting: *Deleted

## 2023-11-18 NOTE — Telephone Encounter (Signed)
  Follow up Call-     11/14/2023   10:31 AM  Call back number  Post procedure Call Back phone  # [phone]  Permission to leave phone message Yes     Patient questions:   Message left to call if necessary.

## 2023-11-19 LAB — SURGICAL PATHOLOGY

## 2023-12-12 ENCOUNTER — Other Ambulatory Visit: Payer: Self-pay

## 2023-12-12 ENCOUNTER — Other Ambulatory Visit: Payer: Self-pay | Admitting: Pharmacy Technician

## 2023-12-12 ENCOUNTER — Ambulatory Visit (INDEPENDENT_AMBULATORY_CARE_PROVIDER_SITE_OTHER): Admitting: Pharmacist

## 2023-12-12 ENCOUNTER — Other Ambulatory Visit (HOSPITAL_COMMUNITY): Payer: Self-pay

## 2023-12-12 DIAGNOSIS — Z113 Encounter for screening for infections with a predominantly sexual mode of transmission: Secondary | ICD-10-CM

## 2023-12-12 DIAGNOSIS — Z79899 Other long term (current) drug therapy: Secondary | ICD-10-CM

## 2023-12-12 MED ORDER — DOXYCYCLINE HYCLATE 100 MG PO TABS
ORAL_TABLET | ORAL | 2 refills | Status: AC
  Filled 2023-12-12: qty 30, 15d supply, fill #0
  Filled 2024-04-07: qty 30, 15d supply, fill #1

## 2023-12-12 MED ORDER — DESCOVY 200-25 MG PO TABS: 1.0000 | ORAL_TABLET | Freq: Every day | ORAL | 2 refills | Status: DC

## 2023-12-12 MED ORDER — DESCOVY 200-25 MG PO TABS
1.0000 | ORAL_TABLET | Freq: Every day | ORAL | 2 refills | Status: AC
  Filled 2023-12-12: qty 30, 30d supply, fill #0
  Filled 2024-01-09: qty 30, 30d supply, fill #1
  Filled 2024-02-05: qty 30, 30d supply, fill #2

## 2023-12-12 MED ORDER — DOXYCYCLINE HYCLATE 100 MG PO TABS: ORAL_TABLET | ORAL | 2 refills | Status: DC
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
## 2023-12-12 NOTE — Progress Notes (Signed)
 Specialty Pharmacy Refill Coordination Note  [name] is a 33 y.o. male contacted today regarding refills of specialty medication(s) Emtricitabine -Tenofovir  AF (Descovy )   Patient requested Pickup at [facility] [place] date: 12/12/23   Medication will be filled on 12/12/23.

## 2023-12-15 ENCOUNTER — Other Ambulatory Visit: Payer: Self-pay | Admitting: Pharmacist

## 2023-12-15 DIAGNOSIS — Z79899 Other long term (current) drug therapy: Secondary | ICD-10-CM

## 2023-12-15 LAB — CT/NG RNA, TMA RECTAL
Chlamydia Trachomatis RNA: NOT DETECTED
Neisseria Gonorrhoeae RNA: NOT DETECTED

## 2023-12-15 LAB — HIV ANTIBODY (ROUTINE TESTING W REFLEX)
HIV 1&2 Ab, 4th Generation: NONREACTIVE
HIV FINAL INTERPRETATION: NEGATIVE

## 2023-12-15 LAB — C. TRACHOMATIS/N. GONORRHOEAE RNA
C. trachomatis RNA, TMA: NOT DETECTED
N. gonorrhoeae RNA, TMA: NOT DETECTED

## 2023-12-15 LAB — RPR: RPR Ser Ql: NONREACTIVE

## 2023-12-15 LAB — GC/CHLAMYDIA PROBE, AMP (THROAT)
Chlamydia trachomatis RNA: NOT DETECTED
Neisseria gonorrhoeae RNA: NOT DETECTED

## 2023-12-15 MED ORDER — DESCOVY 200-25 MG PO TABS: 1.0000 | ORAL_TABLET | Freq: Every day | ORAL | Status: AC

## 2023-12-15 NOTE — Progress Notes (Signed)
 Medication Samples have been provided to the patient.  Drug name: Descovy         Strength: 200/25 mg       Qty: 1 pack (7 tablets)   LOT: [id number]   Exp.Date: 06/14/25  Samples requested by Alan Geralds, PharmD.  Dosing instructions: Take one tablet by mouth once daily  The patient has been instructed regarding the correct time, dose, and frequency of taking this medication, including desired effects and most common side effects.   Daphanie Oquendo L. Indica Marcott, PharmD, BCIDP, AAHIVP, CPP Clinical Pharmacist Practitioner Infectious Diseases Clinical Pharmacist [facility]

## 2023-12-23 ENCOUNTER — Ambulatory Visit: Admitting: Student in an Organized Health Care Education/Training Program

## 2023-12-24 ENCOUNTER — Ambulatory Visit: Admitting: Student in an Organized Health Care Education/Training Program

## 2023-12-31 ENCOUNTER — Ambulatory Visit: Admitting: Gastroenterology

## 2023-12-31 NOTE — Progress Notes (Deleted)
 Mark Boone [id number] 05/07/1990   Chief Complaint:  Referring Provider: Jerrell Cleatus [name] Primary GI MD: Dr. Shila  HPI: Mark Boone is a 33 y.o. male with past medical history of *** who presents today for a complaint of *** .    Seen by PCP 09/22/2023 for persistent pain associated with anal fissure despite use of nitroglycerin  ointment.  Noted to have a healing fissure but advised to discontinue nitroglycerin  ointment.  Also noted discomfort from hemorrhoid and skin tag affecting sexual activity.  Referred to GI for further evaluation.   Recent negative STI testing, normal TSH.  Seen in office 11/12/2023 and at that time endorsed daily rectal pain and bleeding for about 8 months.  Was unable to tolerate nitroglycerin  treatment which she states caused rectal leakage.  On exam had an external hemorrhoid, perianal skin tag, and posterior midline anal fissure with tenderness on exam.  Noted to engage in an anal intercourse. Scheduled for colonoscopy to rule out any underlying malignancy or inflammatory process.  He is interested in potential hemorrhoidectomy/skin tag removal. Started on topical diltiazem/lidocaine  for anal fissure.  On colonoscopy 11/06/2023 was found to have nonbleeding external and internal hemorrhoids and otherwise normal with recommended recall at age 32.  Was sent in topical hydrocortisone  cream to apply twice daily for a week.  He is on HIV PrEP prophylaxis.  Previous GI Procedures/Imaging   Colonoscopy 11/14/2023 - Non- bleeding external and internal hemorrhoids.  - Normal mucosa in the entire examined colon. Biopsied. - Recall age 45  Past Medical History:  Diagnosis Date   Exposure to HIV 11/14/2023   Hemorrhoids     No past surgical history on file.  Current Outpatient Medications  Medication Sig Dispense Refill   AMBULATORY NON FORMULARY MEDICATION Diltiazem gel with 5% lidocaine   Apply a pea sized amount into your rectum  three times daily for 8 weeks Dispense 30 GM (Patient not taking: Reported on 11/14/2023) 30 g 1   doxycycline  (VIBRA -TABS) 100 MG tablet Take 2 tablets (200 mg) by mouth 24-72 hours after a sexual encounter 30 tablet 2   emtricitabine -tenofovir  AF (DESCOVY ) 200-25 MG tablet Take 1 tablet by mouth daily. 30 tablet 2   pramoxine-hydrocortisone  (PROCTOCREAM-HC) 1-1 % rectal cream Place 1 Application rectally 2 (two) times daily. Two times daily for 7 days. 30 g 1   No current facility-administered medications for this visit.    Allergies as of 12/31/2023   (No Known Allergies)    Family History  Problem Relation Age of Onset   Diabetes type II Mother    Hypertension Mother    Sarcoidosis Father    Sarcoidosis Brother    Colon cancer Neg Hx    Colon polyps Neg Hx    Esophageal cancer Neg Hx    Rectal cancer Neg Hx    Stomach cancer Neg Hx     Social History   Tobacco Use   Smoking status: Former    Types: Cigarettes   Smokeless tobacco: Never   Tobacco comments:    3 cigarettes/day        Last cigarette 9 am per pt 11/14/23  Vaping Use   Vaping status: Some Days  Substance Use Topics   Alcohol use: Yes    Comment: Socially    Drug use: Not Currently     Review of Systems:    Constitutional: No weight loss, fever, chills, weakness or fatigue Eyes: No change in vision Ears, Nose, Throat:  No change in hearing or  congestion Skin: No rash or itching Cardiovascular: No chest pain, chest pressure or palpitations   Respiratory: No SOB or cough Gastrointestinal: See HPI and otherwise negative Genitourinary: No dysuria or change in urinary frequency Neurological: No headache, dizziness or syncope Musculoskeletal: No new muscle or joint pain Hematologic: No bleeding or bruising    Physical Exam:  Vital signs: There were no vitals taken for this visit.  Constitutional: NAD, Well developed, Well nourished, alert and cooperative Head:  Normocephalic and atraumatic.   Eyes: No scleral icterus. Conjunctiva pink. Mouth: No oral lesions. Respiratory: Respirations even and unlabored. Lungs clear to auscultation bilaterally.  No wheezes, crackles, or rhonchi.  Cardiovascular:  Regular rate and rhythm. No murmurs. No peripheral edema. Gastrointestinal:  Soft, nondistended, nontender. No rebound or guarding. Normal bowel sounds. No appreciable masses or hepatomegaly. Rectal:  Not performed.  Neurologic:  Alert and oriented x4;  grossly normal neurologically.  Skin:   Dry and intact without significant lesions or rashes. Psychiatric: Oriented to person, place and time. Demonstrates good judgement and reason without abnormal affect or behaviors.   RELEVANT LABS AND IMAGING: CBC    Component Value Date/Time   WBC 3.6 (L) 11/12/2023 1100   RBC 4.19 (L) 11/12/2023 1100   HGB 13.5 11/12/2023 1100   HCT 39.7 11/12/2023 1100   PLT 180.0 11/12/2023 1100   MCV 94.8 11/12/2023 1100   MCH 32.5 05/28/2022 0217   MCHC 33.9 11/12/2023 1100   RDW 12.6 11/12/2023 1100   LYMPHSABS 1.9 11/12/2023 1100   MONOABS 0.3 11/12/2023 1100   EOSABS 0.1 11/12/2023 1100   BASOSABS 0.0 11/12/2023 1100    CMP     Component Value Date/Time   NA 141 11/12/2023 1100   K 3.8 11/12/2023 1100   CL 106 11/12/2023 1100   CO2 25 11/12/2023 1100   GLUCOSE 81 11/12/2023 1100   BUN 13 11/12/2023 1100   CREATININE 0.79 11/12/2023 1100   CREATININE 0.95 02/28/2022 1540   CALCIUM 9.0 11/12/2023 1100   PROT 7.6 11/12/2023 1100   ALBUMIN 4.2 11/12/2023 1100   AST 36 11/12/2023 1100   ALT 29 11/12/2023 1100   ALKPHOS 48 11/12/2023 1100   BILITOT 1.1 11/12/2023 1100   GFRNONAA >60 05/28/2022 0217   GFRAA >60 08/09/2017 1657     Assessment/Plan:       Camie Furbish, PA-C [facility] 12/31/2023, 7:56 AM  Patient Care Team: Jerrell Cleatus Ned, MD as PCP - General (Internal Medicine)

## 2024-01-06 ENCOUNTER — Other Ambulatory Visit: Payer: Self-pay

## 2024-01-09 ENCOUNTER — Other Ambulatory Visit (HOSPITAL_COMMUNITY): Payer: Self-pay

## 2024-01-09 ENCOUNTER — Other Ambulatory Visit: Payer: Self-pay

## 2024-01-09 NOTE — Progress Notes (Signed)
 Specialty Pharmacy Refill Coordination Note  Mark Boone is a 33 y.o. male contacted today regarding refills of specialty medication(s) Emtricitabine -Tenofovir  AF (Descovy )   Patient requested Pickup at [facility] at [place] date: 01/12/24   Medication will be filled on 01/09/24.

## 2024-01-14 ENCOUNTER — Ambulatory Visit: Payer: Self-pay | Admitting: Gastroenterology

## 2024-02-05 ENCOUNTER — Other Ambulatory Visit (HOSPITAL_COMMUNITY): Payer: Self-pay

## 2024-02-09 ENCOUNTER — Other Ambulatory Visit (HOSPITAL_COMMUNITY): Payer: Self-pay

## 2024-02-10 ENCOUNTER — Other Ambulatory Visit: Payer: Self-pay

## 2024-02-10 ENCOUNTER — Other Ambulatory Visit (HOSPITAL_COMMUNITY): Payer: Self-pay

## 2024-02-10 NOTE — Progress Notes (Signed)
 Specialty Pharmacy Refill Coordination Note  Spoke with Mark Boone  Mark Boone is a 33 y.o. male contacted today regarding refills of specialty medication(s) Emtricitabine -Tenofovir  AF (Descovy )  Doses on hand: 1   Patient requested: Pickup at [facility] at [place] date: 02/10/24  Medication will be filled on 02/10/24

## 2024-03-01 NOTE — Progress Notes (Unsigned)
 Date:  03/01/2024   HPI: Mark Boone is a 33 y.o. male who presents to the [facility] pharmacy clinic for HIV PrEP follow-up.  Insured   [x]    Uninsured  []    Referring ID Physician: Corean Fireman, NP   Patient Active Problem List   Diagnosis Date Noted   Anal fissure 08/22/2023   Hemorrhoidal skin tags 08/22/2023   High risk sexual behavior 09/25/2020   On pre-exposure prophylaxis for HIV 09/25/2020    Patient's Medications  New Prescriptions   No medications on file  Previous Medications   AMBULATORY NON FORMULARY MEDICATION    Diltiazem gel with 5% lidocaine   Apply a pea sized amount into your rectum three times daily for 8 weeks Dispense 30 GM   DOXYCYCLINE  (VIBRA -TABS) 100 MG TABLET    Take 2 tablets (200 mg) by mouth 24-72 hours after a sexual encounter   EMTRICITABINE -TENOFOVIR  AF (DESCOVY ) 200-25 MG TABLET    Take 1 tablet by mouth daily.   PRAMOXINE-HYDROCORTISONE  (PROCTOCREAM-HC) 1-1 % RECTAL CREAM    Place 1 Application rectally 2 (two) times daily. Two times daily for 7 days.  Modified Medications   No medications on file  Discontinued Medications   No medications on file    Allergies: Allergies[1]  Past Medical History: Past Medical History:  Diagnosis Date   Exposure to HIV 11/14/2023   Hemorrhoids     Social History: Social History   Socioeconomic History   Marital status: Single    Spouse name: Not on file   Number of children: Not on file   Years of education: Not on file   Highest education level: Not on file  Occupational History   Not on file  Tobacco Use   Smoking status: Former    Types: Cigarettes   Smokeless tobacco: Never   Tobacco comments:    3 cigarettes/day        Last cigarette 9 am per pt 11/14/23  Vaping Use   Vaping status: Some Days  Substance and Sexual Activity   Alcohol use: Yes    Comment: Socially    Drug use: Not Currently   Sexual activity: Yes    Comment: 10/2023- exposure to HIV  Other Topics Concern    Not on file  Social History Narrative   Not on file   Social Drivers of Health   Tobacco Use: Medium Risk (11/14/2023)   Patient History    Smoking Tobacco Use: Former    Smokeless Tobacco Use: Never    Passive Exposure: Not on Actuary Strain: Not on file  Food Insecurity: Not on file  Transportation Needs: Not on file  Physical Activity: Not on file  Stress: Not on file  Social Connections: Unknown (07/31/2021)   [facility] from [facility]   Social Network    Social Network: Not on file  Depression (PHQ2-9): Low Risk (08/22/2023)   Depression (PHQ2-9)    PHQ-2 Score: 0  Alcohol Screen: Not on file  Housing: Not on file  Utilities: Not on file  Health Literacy: Not on file       08/28/2021   11:51 AM  CHL HIV PREP FLOWSHEET RESULTS  Insurance Status Uninsured  Gender at birth Male  Gender identity cis-Male  Risk for HIV Hx of nPEP use  Sex Partners Men only  # sex partners past 3-6 mos 1  Sex activity preferences Insertive and receptive;Oral  Condom use Yes  % condom use 100  Treated for STI? No  HIV symptoms? None    Labs:  SCr: Lab Results  Component Value Date   CREATININE 0.79 11/12/2023   CREATININE 0.76 05/28/2022   CREATININE 0.95 02/28/2022   CREATININE 0.91 01/15/2021   CREATININE 0.91 09/25/2020   HIV Lab Results  Component Value Date   HIV NON-REACTIVE 12/12/2023   HIV NON-REACTIVE 09/05/2023   HIV NON-REACTIVE 06/13/2023   HIV NON-REACTIVE 03/20/2023   HIV NON-REACTIVE 12/11/2022   Hepatitis B Lab Results  Component Value Date   HEPBSAB REACTIVE (A) 09/25/2020   HEPBSAG NON-REACTIVE 09/25/2020   Hepatitis C No results found for: HEPCAB, HCVRNAPCRQN Hepatitis A No results found for: HAV RPR and STI Lab Results  Component Value Date   LABRPR NON-REACTIVE 12/12/2023   LABRPR NON-REACTIVE 09/05/2023   LABRPR NON-REACTIVE 06/13/2023   LABRPR NON-REACTIVE 03/20/2023   LABRPR NON-REACTIVE 09/04/2022     STI Results GC CT  12/11/2022 11:23 AM Negative    Negative    Negative  Negative    Negative    Negative   09/04/2022 11:29 AM Positive    Negative    Negative  Negative    Negative    Negative   05/22/2022 10:45 AM Negative    Negative    Negative  Negative    Negative    Negative   02/28/2022  3:29 PM Negative    Negative    Negative  Negative    Negative    Negative   11/14/2021  2:13 PM Negative    Negative    Negative  Negative    Negative    Negative   08/28/2021 11:53 AM Negative    Negative    Negative  Negative    Negative    Negative   09/25/2020  2:44 PM Negative    Negative    Negative  Negative    Negative    Negative   07/05/2020  9:04 PM Negative  Negative   12/23/2019  2:07 PM Negative  Negative   05/25/2019 12:00 AM Negative  Negative   05/30/2017 12:00 AM Negative  Negative   03/22/2015 12:00 AM Negative  Negative     Assessment: Mark Boone presents to clinic today for PrEP follow-up. Denies any missed doses or adverse effects.  Screened patient for acute HIV symptoms such as fatigue, muscle aches, rash, sore throat, lymphadenopathy, headache, night sweats, nausea/vomiting/diarrhea, and fever.  Still has ~*** tablets left over. No new issues with [facility] delivery. Will check HIV antibody and send in refill once it results negative.  Last STI screening was in September and was negative. *** new sexual partners since last visit; will check *** today.  Plan: - Check HIV antibody - If HIV antibody, refill Descovy  x 3 months - Follow-up with me on ***   Alan Geralds, PharmD, CPP, BCIDP, AAHIVP Clinical Pharmacist Practitioner Infectious Diseases Clinical Pharmacist [facility] 03/01/2024, 3:33 PM      [1] No Known Allergies

## 2024-03-05 ENCOUNTER — Ambulatory Visit: Admitting: Pharmacist

## 2024-03-05 ENCOUNTER — Other Ambulatory Visit: Payer: Self-pay

## 2024-03-05 ENCOUNTER — Other Ambulatory Visit (HOSPITAL_COMMUNITY): Payer: Self-pay

## 2024-03-05 DIAGNOSIS — Z113 Encounter for screening for infections with a predominantly sexual mode of transmission: Secondary | ICD-10-CM

## 2024-03-05 DIAGNOSIS — Z2981 Encounter for HIV pre-exposure prophylaxis: Secondary | ICD-10-CM | POA: Diagnosis not present

## 2024-03-05 DIAGNOSIS — Z79899 Other long term (current) drug therapy: Secondary | ICD-10-CM

## 2024-03-05 MED ORDER — DESCOVY 200-25 MG PO TABS
1.0000 | ORAL_TABLET | Freq: Every day | ORAL | 2 refills | Status: AC
  Filled 2024-03-05 (×2): qty 30, 30d supply, fill #0
  Filled 2024-04-07: qty 30, 30d supply, fill #1

## 2024-03-06 LAB — GC/CHLAMYDIA PROBE, AMP (THROAT)
Chlamydia trachomatis RNA: NOT DETECTED
Neisseria gonorrhoeae RNA: NOT DETECTED

## 2024-03-06 LAB — HIV ANTIBODY (ROUTINE TESTING W REFLEX)
HIV 1&2 Ab, 4th Generation: NONREACTIVE
HIV FINAL INTERPRETATION: NEGATIVE

## 2024-03-06 LAB — C. TRACHOMATIS/N. GONORRHOEAE RNA
C. trachomatis RNA, TMA: NOT DETECTED
N. gonorrhoeae RNA, TMA: NOT DETECTED

## 2024-03-06 LAB — SYPHILIS: RPR W/REFLEX TO RPR TITER AND TREPONEMAL ANTIBODIES, TRADITIONAL SCREENING AND DIAGNOSIS ALGORITHM: RPR Ser Ql: NONREACTIVE

## 2024-03-06 LAB — CT/NG RNA, TMA RECTAL
Chlamydia Trachomatis RNA: NOT DETECTED
Neisseria Gonorrhoeae RNA: NOT DETECTED

## 2024-03-09 ENCOUNTER — Other Ambulatory Visit: Payer: Self-pay

## 2024-03-09 NOTE — Progress Notes (Signed)
 Specialty Pharmacy Refill Coordination Note  Mark Boone is a 33 y.o. male contacted today regarding refills of specialty medication(s) Emtricitabine -Tenofovir  AF (Descovy )   Patient requested Pickup at [facility] at [place] date: 03/10/24   Medication will be filled on: 03/09/24

## 2024-03-10 ENCOUNTER — Other Ambulatory Visit: Payer: Self-pay

## 2024-03-15 ENCOUNTER — Other Ambulatory Visit (HOSPITAL_COMMUNITY): Payer: Self-pay

## 2024-03-15 ENCOUNTER — Other Ambulatory Visit: Payer: Self-pay

## 2024-04-07 ENCOUNTER — Other Ambulatory Visit: Payer: Self-pay

## 2024-04-07 NOTE — Progress Notes (Signed)
 Specialty Pharmacy Refill Coordination Note  Mark Boone is a 34 y.o. male contacted today regarding refills of specialty medication(s) Emtricitabine -Tenofovir  AF (Descovy )   Patient requested Pickup at [facility] at [place] date: 04/08/24   Medication will be filled on: 04/07/24

## 2024-06-04 ENCOUNTER — Ambulatory Visit: Payer: Self-pay | Admitting: Pharmacist
